# Patient Record
Sex: Female | Born: 1955
Health system: Southern US, Community
[De-identification: ages and names within clinical notes are randomized; demographics above are authoritative.]

## PROBLEM LIST (undated history)

## (undated) DIAGNOSIS — K635 Polyp of colon: Secondary | ICD-10-CM

## (undated) DIAGNOSIS — E782 Mixed hyperlipidemia: Secondary | ICD-10-CM

## (undated) HISTORY — PX: POLYPECTOMY: SHX149

## (undated) HISTORY — PX: OTHER SURGICAL HISTORY: SHX169

## (undated) HISTORY — DX: Polyp of colon: K63.5

## (undated) HISTORY — DX: Mixed hyperlipidemia: E78.2

## (undated) HISTORY — PX: COLONOSCOPY: SHX174

---

## 2019-05-15 ENCOUNTER — Other Ambulatory Visit: Payer: Self-pay

## 2019-05-15 ENCOUNTER — Encounter: Payer: Self-pay | Admitting: Family Medicine

## 2019-05-15 ENCOUNTER — Ambulatory Visit (INDEPENDENT_AMBULATORY_CARE_PROVIDER_SITE_OTHER): Payer: Medicare Other | Admitting: Family Medicine

## 2019-05-15 VITALS — BP 124/80 | HR 68 | Temp 97.6°F | Ht 64.5 in | Wt 123.4 lb

## 2019-05-15 DIAGNOSIS — Z136 Encounter for screening for cardiovascular disorders: Secondary | ICD-10-CM | POA: Diagnosis not present

## 2019-05-15 DIAGNOSIS — E2839 Other primary ovarian failure: Secondary | ICD-10-CM

## 2019-05-15 DIAGNOSIS — Z1231 Encounter for screening mammogram for malignant neoplasm of breast: Secondary | ICD-10-CM | POA: Diagnosis not present

## 2019-05-15 DIAGNOSIS — Z0000 Encounter for general adult medical examination without abnormal findings: Secondary | ICD-10-CM | POA: Diagnosis not present

## 2019-05-15 DIAGNOSIS — E782 Mixed hyperlipidemia: Secondary | ICD-10-CM | POA: Diagnosis not present

## 2019-05-15 DIAGNOSIS — Z1211 Encounter for screening for malignant neoplasm of colon: Secondary | ICD-10-CM | POA: Diagnosis not present

## 2019-05-15 DIAGNOSIS — Z860100 Personal history of colon polyps, unspecified: Secondary | ICD-10-CM

## 2019-05-15 DIAGNOSIS — Z23 Encounter for immunization: Secondary | ICD-10-CM

## 2019-05-15 DIAGNOSIS — E785 Hyperlipidemia, unspecified: Secondary | ICD-10-CM | POA: Insufficient documentation

## 2019-05-15 DIAGNOSIS — D225 Melanocytic nevi of trunk: Secondary | ICD-10-CM | POA: Diagnosis not present

## 2019-05-15 DIAGNOSIS — Z8639 Personal history of other endocrine, nutritional and metabolic disease: Secondary | ICD-10-CM | POA: Diagnosis not present

## 2019-05-15 DIAGNOSIS — Z1159 Encounter for screening for other viral diseases: Secondary | ICD-10-CM

## 2019-05-15 DIAGNOSIS — Z8249 Family history of ischemic heart disease and other diseases of the circulatory system: Secondary | ICD-10-CM

## 2019-05-15 DIAGNOSIS — Z79899 Other long term (current) drug therapy: Secondary | ICD-10-CM

## 2019-05-15 NOTE — Progress Notes (Signed)
Cassandra May is a 63 y.o. female who is new to the practice today and presents for an annual wellness visit and CPE.   She has the following concerns:  States she has been out of her cholesterol medication for the past month. Would like a refill.   Needs dermatologist for skin checks.   Gives blood and takes iron.   Previous medical care:  [facility].  Dr. Ansel Bong   Last CPE/AWV:  March 2019   January 2020 for fracture of sacrum. Tripped down the stairs on christmas morning. Did not have LOC. No falls otherwise.   LMP: age 16   Married and has 4 children. 6 grandchildren.  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 Stays physically fit.   Immunization History  Administered Date(s) Administered  . Fluad Quad(high Dose 65+) 05/15/2019     Tdap 08/2016  Last Pap smear: years ago. States previous pcp said it wasn't necessary any longer Last mammogram: march 2018. Overdue  Last colonoscopy: 5 years ago. Overdue. polyps Last DEXA: never Dentist: overdue. Hx of peridontal disease  Ophtho: June 2020  Exercise: 3-5 times a week. Cycle and swim, walks. yoga Diet: fairly healthy but eats too many sweets   She fell Christmas morning missed step.   Other doctors caring for patient include: none  Depression screen:  See questionnaire below.  Depression screen PHQ 2/9 05/15/2019  Decreased Interest 0  Down, Depressed, Hopeless 0  PHQ - 2 Score 0    Fall Risk Screen: see questionnaire below. Fall Risk  05/15/2019  Falls in the past year? 1  Number falls in past yr: 1  Injury with Fall? 1    ADL screen:  See questionnaire below Functional Status Survey: Is the patient deaf or have difficulty hearing?: No Does the patient have difficulty seeing, even when wearing glasses/contacts?: No Does the patient have difficulty concentrating, remembering, or making decisions?: No Does the patient have difficulty walking or climbing stairs?: No Does the  patient have difficulty dressing or bathing?: No Does the patient have difficulty doing errands alone such as visiting a doctor's office or shopping?: No   End of Life Discussion:  Patient has a living will and medical power of attorney.  Stacy Gardner, her son is her HCPOA.   Review of Systems Constitutional: -fever, -chills, -sweats, -unexpected weight change, -anorexia, -fatigue Allergy: -sneezing, -itching, -congestion Dermatology: denies changing moles, rash, lumps, new worrisome lesions ENT: -runny nose, -ear pain, -sore throat, -hoarseness, -sinus pain, -teeth pain, -tinnitus, -hearing loss, -epistaxis Cardiology:  -chest pain, -palpitations, -edema, -orthopnea, -paroxysmal nocturnal dyspnea Respiratory: -cough, -shortness of breath, -dyspnea on exertion, -wheezing, -hemoptysis Gastroenterology: -abdominal pain, -nausea, -vomiting, -diarrhea, -constipation, -blood in stool, -changes in bowel movement, -dysphagia Hematology: -bleeding or bruising problems Musculoskeletal: -arthralgias, -myalgias, -joint swelling, -back pain, -neck pain, -cramping, -gait changes Ophthalmology: -vision changes, -eye redness, -itching, -discharge Urology: -dysuria, -difficulty urinating, -hematuria, -urinary frequency, -urgency, incontinence Neurology: -headache, -weakness, -tingling, -numbness, -speech abnormality, -memory loss, -falls, -dizziness Psychology:  -depressed mood, -agitation, -sleep problems    PHYSICAL EXAM:  BP 124/80   Pulse 68   Temp 97.6 F (36.4 C)   Ht 5' 4.5" (1.638 m)   Wt 123 lb 6.4 oz (56 kg)   BMI 20.85 kg/m   General Appearance: Alert, cooperative, no distress, appears stated age Head: Normocephalic, without obvious abnormality, atraumatic Eyes: PERRL, conjunctiva/corneas clear, EOM's intact Ears: Normal TM's and external ear canals Nose: Mask in place  Throat: Mask  in place  Neck: Supple, no lymphadenopathy; thyroid: no enlargement/tenderness/nodules; no  carotid bruit or JVD Back: Spine nontender, no curvature, ROM normal, no CVA tenderness Lungs: Clear to auscultation bilaterally without wheezes, rales or ronchi; respirations unlabored Chest Wall: No tenderness or deformity Heart: Regular rate and rhythm, S1 and S2 normal, no murmur, rub or gallop Breast Exam: declines. Mammogram ordered Abdomen: Soft, non-tender, nondistended, normoactive bowel sounds, no masses, no hepatosplenomegaly Genitalia: Normal external genitalia without lesions.  BUS and vagina normal; cervix without lesions, or cervical motion tenderness. No abnormal vaginal discharge.  Uterus and adnexa not enlarged, nontender, no masses.   Rectal: skin tag present at 10 0'clock Extremities: No clubbing, cyanosis or edema Pulses: 2+ and symmetric all extremities Skin: Skin color, texture, turgor normal, no rashes or lesions. Abnormal nevus on her mid back, pigmentation suspicious Lymph nodes: Cervical, supraclavicular, and axillary nodes normal Neurologic: CNII-XII intact, normal strength, sensation and gait; reflexes 2+ and symmetric throughout Psych: Normal mood, affect, hygiene and grooming.  ASSESSMENT/PLAN: Welcome to Medicare preventive visit - Plan: EKG 12-Lead -She is new to the practice and here today for her first Medicare annual wellness visit.  She has had Medicare since June 2020.  Denies any problems with ADLs, memory or mood.  She did have one fall on Christmas, this was mechanical.  No falls since.  She is quite active and enjoys skiing.  Advance directives discussed and she does have these at home.  The MOST form was discussed and filled out today.  Routine general medical examination at a health care facility - Plan: CBC with Differential/Platelet, Comprehensive metabolic panel, TSH, T4, free, Lipid panel -Discussed preventive healthcare.  She will call and schedule her mammogram and bone density.  Referral to GI for colonoscopy.  Declines Pap smear.  States she  has never had an abnormal Pap smear and her previous PCP said that she no longer needed them. She appears to take good care of herself with a healthy diet and exercise.  Immunizations reviewed and flu shot given.  Counseled her on Shingrix vaccine.  Recommend she check with her insurance company and get this at her local pharmacy when she is ready.  Estrogen deficiency - Plan: DG Bone Density -She will call and schedule bone density.  This will be her first DEXA.  History of colon polyps - Plan: Ambulatory referral to Gastroenterology -Per patient she is overdue for her 5-year follow-up  Encounter for screening mammogram for malignant neoplasm of breast - Plan: MM DIGITAL SCREENING BILATERAL  Mixed hyperlipidemia - Plan: Lipid panel -She will reports being well controlled on her current dose of statin.  Refill pending labs  Screen for colon cancer - Plan: Ambulatory referral to Gastroenterology She is overdue for her 5-year colonoscopy per patient.  I do not have these records but I am requesting them.  Need for hepatitis C screening test - Plan: Hepatitis C antibody -Done per guidelines  History of vitamin D deficiency - Plan: VITAMIN D 25 Hydroxy (Vit-D Deficiency, Fractures) -Check vitamin D level and follow-up  Medication management -Adjust vitamin D and statin as appropriate pending labs  Screening for heart disease - Plan: EKG 12-Lead -EKG unremarkable.  Family history of heart disease in brother - Plan: EKG 12-Lead  Needs flu shot - Plan: Flu Vaccine QUAD High Dose(Fluad)  Atypical nevus of back -She will call and schedule with a dermatologist.  A list was provided for her.     Discussed monthly self breast exams and  yearly mammograms; at least 30 minutes of aerobic activity at least 5 days/week and weight-bearing exercise 2x/week; proper sunscreen use reviewed; healthy diet, including goals of calcium and vitamin D intake and alcohol recommendations (less than or equal  to 1 drink/day) reviewed; regular seatbelt use; changing batteries in smoke detectors.  Immunization recommendations discussed.  Colonoscopy recommendations reviewed   Medicare Attestation I have personally reviewed: The patient's medical and social history Their use of alcohol, tobacco or illicit drugs Their current medications and supplements The patient's functional ability including ADLs,fall risks, home safety risks, cognitive, and hearing and visual impairment Diet and physical activities Evidence for depression or mood disorders  The patient's weight, height, and BMI have been recorded in the chart.  I have made referrals, counseling, and provided education to the patient based on review of the above and I have provided the patient with a written personalized care plan for preventive services.     Harland Dingwall, NP-C   05/15/2019

## 2019-05-15 NOTE — Patient Instructions (Addendum)
Dermatology offices  [facility]: Phone #: [phone] Address: [address] [place], [place] [address]  [facility]: Phone: [phone]  Address: [address] [place], [place] [address]  [facility] Address: [address] Barbara Cower [place], [place] [address] Phone: [phone]   Dermatology offices  [facility]: Phone #: [phone] Address: [address] [place], [place] [address]  [facility]: Phone: [phone]  Address: [address] [place], [place] [address]  [facility] Address: [place], [place], [place] [address] Phone: [phone]   [facility] [place] [phone]   Cassandra May , Thank you for taking time to come for your Medicare Wellness Visit. I appreciate your ongoing commitment to your health goals. Please review the following plan we discussed and let me know if I can assist you in the future.   These are the goals we discussed:  Continue taking good care of yourself.   Check back in the spring regarding Shingrix vaccine. You actually would need to get this at your local pharmacy for Medicare to pay for it. Check and see how much it will cost prior to getting the vaccine.   We will call with your lab results.   This is a list of the screening recommended for you and due dates:  Health Maintenance  Topic Date Due  .  Hepatitis C: One time screening is recommended by Center for Disease Control  (CDC) for  adults born from [birth year] through 1967.   24-Jan-1956  . HIV Screening  01/01/1969  . Tetanus Vaccine  01/01/1973  . Pap Smear  01/02/1975  . Mammogram  01/02/2004  . Colon Cancer Screening  01/02/2004  . DEXA scan (bone density measurement)  01/02/2019  . Pneumonia vaccines (1 of 2 - PCV13) 01/02/2019  . Flu Shot  03/03/2019    Preventive Care 65 Years and Older, Female Preventive care refers to  lifestyle choices and visits with your health care provider that can promote health and wellness. This includes:  A yearly physical exam. This is also called an annual well check.  Regular dental and eye exams.  Immunizations.  Screening for certain conditions.  Healthy lifestyle choices, such as diet and exercise. What can I expect for my preventive care visit? Physical exam Your health care provider will check:  Height and weight. These may be used to calculate body mass index (BMI), which is a measurement that tells if you are at a healthy weight.  Heart rate and blood pressure.  Your skin for abnormal spots. Counseling Your health care provider may ask you questions about:  Alcohol, tobacco, and drug use.  Emotional well-being.  Home and relationship well-being.  Sexual activity.  Eating habits.  History of falls.  Memory and ability to understand (cognition).  Work and work Statistician.  Pregnancy and menstrual history. What immunizations do I need?  Influenza (flu) vaccine  This is recommended every year. Tetanus, diphtheria, and pertussis (Tdap) vaccine  You may need a Td booster every 10 years. Varicella (chickenpox) vaccine  You may need this vaccine if you have not already been vaccinated. Zoster (shingles) vaccine  You may need this after age 48. Pneumococcal conjugate (PCV13) vaccine  One dose is recommended after age 33. Pneumococcal polysaccharide (PPSV23) vaccine  One dose is recommended after age 41. Measles, mumps, and rubella (MMR) vaccine  You may need at least one dose of MMR if you were born in 1957 or later. You may  also need a second dose. Meningococcal conjugate (MenACWY) vaccine  You may need this if you have certain conditions. Hepatitis A vaccine  You may need this if you have certain conditions or if you travel or work in places where you may be exposed to hepatitis A. Hepatitis B vaccine  You may need this if you have  certain conditions or if you travel or work in places where you may be exposed to hepatitis B. Haemophilus influenzae type b (Hib) vaccine  You may need this if you have certain conditions. You may receive vaccines as individual doses or as more than one vaccine together in one shot (combination vaccines). Talk with your health care provider about the risks and benefits of combination vaccines. What tests do I need? Blood tests  Lipid and cholesterol levels. These may be checked every 5 years, or more frequently depending on your overall health.  Hepatitis C test.  Hepatitis B test. Screening  Lung cancer screening. You may have this screening every year starting at age 38 if you have a 30-pack-year history of smoking and currently smoke or have quit within the past 15 years.  Colorectal cancer screening. All adults should have this screening starting at age 79 and continuing until age 12. Your health care provider may recommend screening at age 27 if you are at increased risk. You will have tests every 1-10 years, depending on your results and the type of screening test.  Diabetes screening. This is done by checking your blood sugar (glucose) after you have not eaten for a while (fasting). You may have this done every 1-3 years.  Mammogram. This may be done every 1-2 years. Talk with your health care provider about how often you should have regular mammograms.  BRCA-related cancer screening. This may be done if you have a family history of breast, ovarian, tubal, or peritoneal cancers. Other tests  Sexually transmitted disease (STD) testing.  Bone density scan. This is done to screen for osteoporosis. You may have this done starting at age 62. Follow these instructions at home: Eating and drinking  Eat a diet that includes fresh fruits and vegetables, whole grains, lean protein, and low-fat dairy products. Limit your intake of foods with high amounts of sugar, saturated fats, and  salt.  Take vitamin and mineral supplements as recommended by your health care provider.  Do not drink alcohol if your health care provider tells you not to drink.  If you drink alcohol: ? Limit how much you have to 0-1 drink a day. ? Be aware of how much alcohol is in your drink. In the [place] one drink equals one 12 oz bottle of beer (355 mL), one 5 oz glass of wine (148 mL), or one 1 oz glass of hard liquor (44 mL). Lifestyle  Take daily care of your teeth and gums.  Stay active. Exercise for at least 30 minutes on 5 or more days each week.  Do not use any products that contain nicotine or tobacco, such as cigarettes, e-cigarettes, and chewing tobacco. If you need help quitting, ask your health care provider.  If you are sexually active, practice safe sex. Use a condom or other form of protection in order to prevent STIs (sexually transmitted infections).  Talk with your health care provider about taking a low-dose aspirin or statin. What's next?  Go to your health care provider once a year for a well check visit.  Ask your health care provider how often you should have your  eyes and teeth checked.  Stay up to date on all vaccines. This information is not intended to replace advice given to you by your health care provider. Make sure you discuss any questions you have with your health care provider. Document Released: 08/15/2015 Document Revised: 07/13/2018 Document Reviewed: 07/13/2018 Elsevier Patient Education  2020 [employer].

## 2019-05-16 ENCOUNTER — Other Ambulatory Visit: Payer: Self-pay | Admitting: Family Medicine

## 2019-05-16 LAB — COMPREHENSIVE METABOLIC PANEL
ALT: 10 IU/L (ref 0–32)
AST: 24 IU/L (ref 0–40)
Albumin/Globulin Ratio: 1.8 (ref 1.2–2.2)
Albumin: 4.3 g/dL (ref 3.8–4.8)
Alkaline Phosphatase: 85 IU/L (ref 39–117)
BUN/Creatinine Ratio: 17 (ref 12–28)
BUN: 13 mg/dL (ref 8–27)
Bilirubin Total: 0.3 mg/dL (ref 0.0–1.2)
CO2: 23 mmol/L (ref 20–29)
Calcium: 9.9 mg/dL (ref 8.7–10.3)
Chloride: 104 mmol/L (ref 96–106)
Creatinine, Ser: 0.78 mg/dL (ref 0.57–1.00)
GFR calc Af Amer: 92 mL/min/{1.73_m2} (ref >59)
GFR calc non Af Amer: 80 mL/min/{1.73_m2} (ref >59)
Globulin, Total: 2.4 g/dL (ref 1.5–4.5)
Glucose: 91 mg/dL (ref 65–99)
Potassium: 4.3 mmol/L (ref 3.5–5.2)
Sodium: 141 mmol/L (ref 134–144)
Total Protein: 6.7 g/dL (ref 6.0–8.5)

## 2019-05-16 LAB — CBC WITH DIFFERENTIAL/PLATELET
Basophils Absolute: 0.1 10*3/uL (ref 0.0–0.2)
Basos: 1 %
EOS (ABSOLUTE): 0.1 10*3/uL (ref 0.0–0.4)
Eos: 2 %
Hematocrit: 40 % (ref 34.0–46.6)
Hemoglobin: 13 g/dL (ref 11.1–15.9)
Immature Grans (Abs): 0 10*3/uL (ref 0.0–0.1)
Immature Granulocytes: 0 %
Lymphocytes Absolute: 2 10*3/uL (ref 0.7–3.1)
Lymphs: 30 %
MCH: 29 pg (ref 26.6–33.0)
MCHC: 32.5 g/dL (ref 31.5–35.7)
MCV: 89 fL (ref 79–97)
Monocytes Absolute: 0.6 10*3/uL (ref 0.1–0.9)
Monocytes: 9 %
Neutrophils Absolute: 4 10*3/uL (ref 1.4–7.0)
Neutrophils: 58 %
Platelets: 230 10*3/uL (ref 150–450)
RBC: 4.49 x10E6/uL (ref 3.77–5.28)
RDW: 12.3 % (ref 11.7–15.4)
WBC: 6.8 10*3/uL (ref 3.4–10.8)

## 2019-05-16 LAB — VITAMIN D 25 HYDROXY (VIT D DEFICIENCY, FRACTURES): Vit D, 25-Hydroxy: 36.2 ng/mL (ref 30.0–100.0)

## 2019-05-16 LAB — LIPID PANEL
Chol/HDL Ratio: 4.3 ratio (ref 0.0–4.4)
Cholesterol, Total: 278 mg/dL — ABNORMAL HIGH (ref 100–199)
HDL: 65 mg/dL (ref >39)
LDL Chol Calc (NIH): 185 mg/dL — ABNORMAL HIGH (ref 0–99)
Triglycerides: 156 mg/dL — ABNORMAL HIGH (ref 0–149)
VLDL Cholesterol Cal: 28 mg/dL (ref 5–40)

## 2019-05-16 LAB — HEPATITIS C ANTIBODY: Hep C Virus Ab: <0.1 s/co ratio (ref 0.0–0.9)

## 2019-05-16 LAB — T4, FREE: Free T4: 1 ng/dL (ref 0.82–1.77)

## 2019-05-16 LAB — TSH: TSH: 2.65 u[IU]/mL (ref 0.450–4.500)

## 2019-05-16 MED ORDER — ATORVASTATIN CALCIUM 10 MG PO TABS: 10.0000 mg | ORAL_TABLET | Freq: Every day | ORAL | 1 refills | Status: DC

## 2019-05-18 ENCOUNTER — Encounter: Payer: Self-pay | Admitting: Internal Medicine

## 2019-06-15 ENCOUNTER — Encounter: Payer: Self-pay | Admitting: Family Medicine

## 2019-08-07 ENCOUNTER — Other Ambulatory Visit: Payer: Self-pay

## 2019-08-07 ENCOUNTER — Ambulatory Visit
Admission: RE | Admit: 2019-08-07 | Discharge: 2019-08-07 | Disposition: A | Discharge status: Home / Self Care | Payer: Medicare Other | Source: Ambulatory Visit | Attending: Family Medicine | Admitting: Family Medicine

## 2019-08-07 DIAGNOSIS — Z1231 Encounter for screening mammogram for malignant neoplasm of breast: Secondary | ICD-10-CM

## 2019-08-07 DIAGNOSIS — E2839 Other primary ovarian failure: Secondary | ICD-10-CM

## 2019-09-07 ENCOUNTER — Other Ambulatory Visit: Payer: Self-pay | Admitting: Family Medicine

## 2019-09-21 ENCOUNTER — Telehealth: Payer: Self-pay | Admitting: Gastroenterology

## 2019-09-21 NOTE — Telephone Encounter (Signed)
Patient referred for colonoscopy.  Patient had one in 2015 at [place].  I am sending you records for review please advise on scheduling

## 2019-09-21 NOTE — Telephone Encounter (Signed)
I am waiting for the records. Not available in [place] today.

## 2019-09-25 ENCOUNTER — Encounter: Payer: Self-pay | Admitting: Gastroenterology

## 2019-10-22 ENCOUNTER — Ambulatory Visit (AMBULATORY_SURGERY_CENTER): Payer: Self-pay | Admitting: *Deleted

## 2019-10-22 ENCOUNTER — Other Ambulatory Visit: Payer: Self-pay

## 2019-10-22 VITALS — Temp 96.2°F | Ht 64.56 in | Wt 126.0 lb

## 2019-10-22 DIAGNOSIS — Z86010 Personal history of colonic polyps: Secondary | ICD-10-CM

## 2019-10-22 DIAGNOSIS — Z01818 Encounter for other preprocedural examination: Secondary | ICD-10-CM

## 2019-10-22 MED ORDER — SUPREP BOWEL PREP KIT 17.5-3.13-1.6 GM/177ML PO SOLN: 1.0000 | Freq: Once | ORAL | 0 refills | Status: AC

## 2019-10-22 NOTE — Progress Notes (Signed)
09-14-2019 completed vaccine series for COVID   No egg or soy allergy known to patient  No issues with past sedation with any surgeries  or procedures, no intubation problems  No diet pills per patient No home 02 use per patient  No blood thinners per patient  Pt denies issues with constipation  No A fib or A flutter  EMMI video sent to pt's e mail   Due to the COVID-19 pandemic we are asking patients to follow these guidelines. Please only bring one care partner. Please be aware that your care partner may wait in the car in the parking lot or if they feel like they will be too hot to wait in the car, they may wait in the lobby on the 4th floor. All care partners are required to wear a mask the entire time (we do not have any that we can provide them), they need to practice social distancing, and we will do a Covid check for all patient's and care partners when you arrive. Also we will check their temperature and your temperature. If the care partner waits in their car they need to stay in the parking lot the entire time and we will call them on their cell phone when the patient is ready for discharge so they can bring the car to the front of the building. Also all patient's will need to wear a mask into building.

## 2019-11-05 ENCOUNTER — Encounter: Payer: Self-pay | Admitting: Gastroenterology

## 2019-11-05 ENCOUNTER — Other Ambulatory Visit: Payer: Self-pay

## 2019-11-05 ENCOUNTER — Ambulatory Visit (AMBULATORY_SURGERY_CENTER): Payer: Medicare Other | Admitting: Gastroenterology

## 2019-11-05 VITALS — BP 132/74 | HR 61 | Temp 96.6°F | Resp 13 | Ht 64.0 in | Wt 126.0 lb

## 2019-11-05 DIAGNOSIS — Z86010 Personal history of colonic polyps: Secondary | ICD-10-CM

## 2019-11-05 DIAGNOSIS — D123 Benign neoplasm of transverse colon: Secondary | ICD-10-CM | POA: Diagnosis not present

## 2019-11-05 DIAGNOSIS — D125 Benign neoplasm of sigmoid colon: Secondary | ICD-10-CM

## 2019-11-05 DIAGNOSIS — D122 Benign neoplasm of ascending colon: Secondary | ICD-10-CM | POA: Diagnosis not present

## 2019-11-05 MED ORDER — SODIUM CHLORIDE 0.9 % IV SOLN: 500.0000 mL | Freq: Once | INTRAVENOUS | Status: DC

## 2019-11-05 NOTE — Progress Notes (Signed)
Temp check by:LC Vital check by:CW  The patient states no changes in medical or surgical history since pre-visit screening on 10/22/19.

## 2019-11-05 NOTE — Progress Notes (Signed)
To Pacu, VSS. Report to [email or web address] 

## 2019-11-05 NOTE — Op Note (Signed)
[place] Patient Name: Cassandra May Procedure Date: 11/05/2019 8:58 AM MRN: [id number] Endoscopist: Thornton Park MD, MD Age: 64 Referring MD:  Date of Birth: 1956-08-02 Gender: Female Account #: [id number] Procedure:                Colonoscopy Indications:              Surveillance: Personal history of adenomatous                            polyps on last colonoscopy 5 years ago                           No known family history of colon cancer or polyps                           Prior colonoscopies at the [facility] by Dr.                            Hilbert Corrigan included:                           08/28/10: 74mm TVA in the rectum, 22mm sigmoid                            tubular adenoma, sigmoid diverticulosis                           03/06/14: 38mm polyp in the ascending colon,                            pancolonic diverticulosis                           Surveillance recommended in 5 years Medicines:                Monitored Anesthesia Care Procedure:                Pre-Anesthesia Assessment:                           - Prior to the procedure, a History and Physical                            was performed, and patient medications and                            allergies were reviewed. The patient's tolerance of                            previous anesthesia was also reviewed. The risks                            and benefits of the procedure and the sedation                            options and risks were discussed  with the patient.                            All questions were answered, and informed consent                            was obtained. Prior Anticoagulants: The patient has                            taken no previous anticoagulant or antiplatelet                            agents. ASA Grade Assessment: II - A patient with                            mild systemic disease. After reviewing the risks                            and benefits, the  patient was deemed in                            satisfactory condition to undergo the procedure.                           After obtaining informed consent, the colonoscope                            was passed under direct vision. Throughout the                            procedure, the patient's blood pressure, pulse, and                            oxygen saturations were monitored continuously. The                            Colonoscope was introduced through the anus and                            advanced to the 4 cm into the ileum. A second                            forward view of the right colon was performed. The                            colonoscopy was performed without difficulty. The                            patient tolerated the procedure well. The quality                            of the bowel preparation was good. The terminal  ileum, ileocecal valve, appendiceal orifice, and                            rectum were photographed. Scope In: 9:04:26 AM Scope Out: 9:18:00 AM Scope Withdrawal Time: 0 hours 9 minutes 32 seconds  Total Procedure Duration: 0 hours 13 minutes 34 seconds  Findings:                 The perianal and digital rectal examinations were                            normal.                           Non-bleeding internal hemorrhoids were found. The                            hemorrhoids were small.                           Multiple small and large-mouthed diverticula were                            found in the sigmoid colon and descending colon.                           A 3 mm polyp was found in the sigmoid colon. The                            polyp was sessile. The polyp was removed with a                            cold snare. Resection and retrieval were complete.                            Estimated blood loss was minimal.                           Two sessile polyps were found in the hepatic                             flexure. The polyps were 2 mm in size. These polyps                            were removed with a cold snare. Resection and                            retrieval were complete. Estimated blood loss was                            minimal.                           A 3 mm polyp was found in the proximal ascending  colon. The polyp was sessile. The polyp was removed                            with a cold snare. Resection and retrieval were                            complete. Estimated blood loss was minimal.                           The exam was otherwise without abnormality on                            direct and retroflexion views. Complications:            No immediate complications. Estimated blood loss:                            Minimal. Estimated Blood Loss:     Estimated blood loss was minimal. Impression:               - Non-bleeding internal hemorrhoids.                           - Diverticulosis in the sigmoid colon and in the                            descending colon.                           - One 3 mm polyp in the sigmoid colon, removed with                            a cold snare. Resected and retrieved.                           - Two 2 mm polyps at the hepatic flexure, removed                            with a cold snare. Resected and retrieved.                           - One 3 mm polyp in the proximal ascending colon,                            removed with a cold snare. Resected and retrieved.                           - The examination was otherwise normal on direct                            and retroflexion views. Recommendation:           - Patient has a contact number available for  emergencies. The signs and symptoms of potential                            delayed complications were discussed with the                            patient. Return to normal activities tomorrow.                            Written  discharge instructions were provided to the                            patient.                           - Continue present medications.                           - Await pathology results.                           - Repeat colonoscopy date to be determined after                            pending pathology results are reviewed for                            surveillance.                           - Follow a high fiber diet. Drink at least 64                            ounces of water daily. Add a daily stool bulking                            agent such as psyllium (an exampled would be                            Metamucil).                           - Emerging evidence supports eating a diet of                            fruits, vegetables, grains, calcium, and yogurt                            while reducing red meat and alcohol may reduce the                            risk of colon cancer.                           - Thank you for allowing me to be involved in your  colon cancer prevention. Thornton Park MD, MD 11/05/2019 9:27:21 AM This report has been signed electronically.

## 2019-11-05 NOTE — Patient Instructions (Signed)
Handouts provided on polyps, diverticulosis, hemorrhoids, and high-fiber diet.  Follow a high fiber diet. Drink at least 64 ounces of water daily. Add a daily stool bulking agent such as psyllium (an example would be Metamuci).  YOU HAD AN ENDOSCOPIC PROCEDURE TODAY AT [facility]:   Refer to the procedure report that was given to you for any specific questions about what was found during the examination.  If the procedure report does not answer your questions, please call your gastroenterologist to clarify.  If you requested that your care partner not be given the details of your procedure findings, then the procedure report has been included in a sealed envelope for you to review at your convenience later.  YOU SHOULD EXPECT: Some feelings of bloating in the abdomen. Passage of more gas than usual.  Walking can help get rid of the air that was put into your GI tract during the procedure and reduce the bloating. If you had a lower endoscopy (such as a colonoscopy or flexible sigmoidoscopy) you may notice spotting of blood in your stool or on the toilet paper. If you underwent a bowel prep for your procedure, you may not have a normal bowel movement for a few days.  Please Note:  You might notice some irritation and congestion in your nose or some drainage.  This is from the oxygen used during your procedure.  There is no need for concern and it should clear up in a day or so.  SYMPTOMS TO REPORT IMMEDIATELY:   Following lower endoscopy (colonoscopy or flexible sigmoidoscopy):  Excessive amounts of blood in the stool  Significant tenderness or worsening of abdominal pains  Swelling of the abdomen that is new, acute  Fever of 100F or higher   For urgent or emergent issues, a gastroenterologist can be reached at any hour by calling [phone]. Do not use MyChart messaging for urgent concerns.    DIET:  We do recommend a small meal at first, but then you may proceed to your  regular diet.  Drink plenty of fluids but you should avoid alcoholic beverages for 24 hours.  ACTIVITY:  You should plan to take it easy for the rest of today and you should NOT DRIVE or use heavy machinery until tomorrow (because of the sedation medicines used during the test).    FOLLOW UP: Our staff will call the number listed on your records 48-72 hours following your procedure to check on you and address any questions or concerns that you may have regarding the information given to you following your procedure. If we do not reach you, we will leave a message.  We will attempt to reach you two times.  During this call, we will ask if you have developed any symptoms of COVID 19. If you develop any symptoms (ie: fever, flu-like symptoms, shortness of breath, cough etc.) before then, please call [phone].  If you test positive for Covid 19 in the 2 weeks post procedure, please call and report this information to [place].    If any biopsies were taken you will be contacted by phone or by letter within the next 1-3 weeks.  Please call us at [phone] if you have not heard about the biopsies in 3 weeks.    SIGNATURES/CONFIDENTIALITY: You and/or your care partner have signed paperwork which will be entered into your electronic medical record.  These signatures attest to the fact that that the information above on your After Visit Summary has been  reviewed and is understood.  Full responsibility of the confidentiality of this discharge information lies with you and/or your care-partner.

## 2019-11-05 NOTE — Progress Notes (Signed)
Called to room to assist during endoscopic procedure.  Patient ID and intended procedure confirmed with present staff. Received instructions for my participation in the procedure from the performing physician.  

## 2019-11-07 ENCOUNTER — Telehealth: Payer: Self-pay

## 2019-11-07 ENCOUNTER — Encounter: Payer: Self-pay | Admitting: Gastroenterology

## 2019-11-07 NOTE — Telephone Encounter (Signed)
  Follow up Call-  Call back number 11/05/2019  Post procedure Call Back phone  # [phone]  Permission to leave phone message Yes     Patient questions:  Do you have a fever, pain , or abdominal swelling? No. Pain Score  0 *  Have you tolerated food without any problems? Yes.    Have you been able to return to your normal activities? Yes.    Do you have any questions about your discharge instructions: Diet   No. Medications  No. Follow up visit  No.  Do you have questions or concerns about your Care? No.  Actions: * If pain score is 4 or above: No action needed, pain <4. 1. Have you developed a fever since your procedure? no  2.   Have you had an respiratory symptoms (SOB or cough) since your procedure? no  3.   Have you tested positive for COVID 19 since your procedure no  4.   Have you had any family members/close contacts diagnosed with the COVID 19 since your procedure?  no   If yes to any of these questions please route to Joylene John, RN and Erenest Rasher, RN

## 2020-02-16 ENCOUNTER — Other Ambulatory Visit: Payer: Self-pay | Admitting: Family Medicine

## 2020-03-06 ENCOUNTER — Encounter: Payer: Self-pay | Admitting: Family Medicine

## 2020-05-11 ENCOUNTER — Other Ambulatory Visit: Payer: Self-pay | Admitting: Family Medicine

## 2020-05-12 NOTE — Telephone Encounter (Signed)
Pt has upcoming appt on 10/14/ and was last refill 7/19 so she should have enough

## 2020-05-15 ENCOUNTER — Encounter: Payer: Self-pay | Admitting: Family Medicine

## 2020-05-15 ENCOUNTER — Ambulatory Visit (INDEPENDENT_AMBULATORY_CARE_PROVIDER_SITE_OTHER): Payer: Medicare Other | Admitting: Family Medicine

## 2020-05-15 ENCOUNTER — Other Ambulatory Visit: Payer: Self-pay

## 2020-05-15 VITALS — BP 120/80 | HR 66 | Ht 63.0 in | Wt 120.4 lb

## 2020-05-15 DIAGNOSIS — Z114 Encounter for screening for human immunodeficiency virus [HIV]: Secondary | ICD-10-CM

## 2020-05-15 DIAGNOSIS — M8580 Other specified disorders of bone density and structure, unspecified site: Secondary | ICD-10-CM

## 2020-05-15 DIAGNOSIS — Z0000 Encounter for general adult medical examination without abnormal findings: Secondary | ICD-10-CM | POA: Diagnosis not present

## 2020-05-15 DIAGNOSIS — Z86010 Personal history of colonic polyps: Secondary | ICD-10-CM | POA: Diagnosis not present

## 2020-05-15 DIAGNOSIS — Z23 Encounter for immunization: Secondary | ICD-10-CM | POA: Diagnosis not present

## 2020-05-15 DIAGNOSIS — Z8639 Personal history of other endocrine, nutritional and metabolic disease: Secondary | ICD-10-CM | POA: Diagnosis not present

## 2020-05-15 DIAGNOSIS — E782 Mixed hyperlipidemia: Secondary | ICD-10-CM

## 2020-05-15 NOTE — Progress Notes (Signed)
Annual Wellness Visit     Patient: Cassandra May, Female    DOB: 1956/07/15, 64 y.o.   MRN: [id number] Visit Date: 05/15/2020  Today's Provider: Harland Dingwall, NP-C   Chief Complaint  Patient presents with   Medicare Wellness   Hyperlipidemia   Subjective    Cassandra May is a 64 y.o. female who presents today for her Annual Wellness Visit. She reports consuming a low protein and plant base diet. Home exercise routine includes swim,hike, bike and walking. She generally feels well. She reports sleeping well. She does have additional problems to discuss today.   HPI Hyperlipidemia Patient is currently taking Atorvastatin 10 MG. She reports good compliance with treatment and no side effects.  EKG done 2 years ago.  States she had a full cardiac work up including stress test, echo which was negative. 5 years ago at [facility].   Colonoscopy recall in 2024 (3 years) due to polyps  States she does breast exams monthly.  Pap smear UTD  She retired and is enjoying life and staying busy.     Medications: Outpatient Medications Prior to Visit  Medication Sig   atorvastatin (LIPITOR) 10 MG tablet TAKE 1 TABLET BY MOUTH  DAILY   ferrous sulfate 325 (65 FE) MG tablet Take 325 mg by mouth daily with breakfast.   Multiple Vitamins-Iron (MULTIPLE VITAMIN/IRON PO) Take by mouth.   No facility-administered medications prior to visit.    No Known Allergies  Patient Care Team: Girtha Rm, NP-C as PCP - General (Family Medicine)  Review of Systems Pertinent positives and negatives in the history of present illness.    Objective    Vitals: BP 120/80 (BP Location: Right Arm, Patient Position: Sitting, Cuff Size: Normal)    Pulse 66    Ht 5\' 3"  (1.6 m)    Wt 120 lb 6.4 oz (54.6 kg)    SpO2 96%    BMI 21.33 kg/m   Physical Exam Constitutional:      Appearance: Normal appearance. She is normal weight.  Eyes:     Extraocular Movements:  Extraocular movements intact.     Conjunctiva/sclera: Conjunctivae normal.     Pupils: Pupils are equal, round, and reactive to light.  Cardiovascular:     Rate and Rhythm: Normal rate and regular rhythm.     Pulses: Normal pulses.  Pulmonary:     Effort: Pulmonary effort is normal.     Breath sounds: Normal breath sounds.  Abdominal:     General: Abdomen is flat. Bowel sounds are normal.     Palpations: Abdomen is soft.     Tenderness: There is no abdominal tenderness. There is no right CVA tenderness, left CVA tenderness or rebound.  Musculoskeletal:        General: Normal range of motion.     Cervical back: Normal range of motion and neck supple.  Skin:    General: Skin is warm and dry.     Capillary Refill: Capillary refill takes less than 2 seconds.  Neurological:     General: No focal deficit present.     Mental Status: She is alert and oriented to person, place, and time.  Psychiatric:        Mood and Affect: Mood normal.        Behavior: Behavior normal.     Most recent functional status assessment: In your present state of health, do you have any difficulty performing the following activities: 05/15/2020  Hearing? N  Vision?  N  Difficulty concentrating or making decisions? N  Walking or climbing stairs? N  Dressing or bathing? N  Doing errands, shopping? N  Some recent data might be hidden   Most recent fall risk assessment: Fall Risk  05/15/2020  Falls in the past year? 0  Number falls in past yr: 0  Injury with Fall? 0  Risk for fall due to : No Fall Risks  Follow up Falls evaluation completed    Most recent depression screenings: PHQ 2/9 Scores 05/15/2020 05/15/2019  PHQ - 2 Score 0 0   Most recent cognitive screening: No flowsheet data found. Most recent Audit-C alcohol use screening No flowsheet data found. A score of 3 or more in women, and 4 or more in men indicates increased risk for alcohol abuse, EXCEPT if all of the points are from question 1     No results found for any visits on 05/15/20.  Assessment & Plan    Encounter for Medicare annual wellness exam  Routine general medical examination at a health care facility - Plan: TSH, Lipid panel, Comprehensive metabolic panel, CBC with Differential/Platelet  Mixed hyperlipidemia - Plan: Lipid panel  History of vitamin D deficiency - Plan: VITAMIN D 25 Hydroxy (Vit-D Deficiency, Fractures)  History of colon polyps  Osteopenia, unspecified location  Need for influenza vaccination - Plan: Flu Vaccine QUAD High Dose(Fluad)  Screening for HIV without presence of risk factors - Plan: HIV Antibody (routine testing w rflx)   Annual wellness visit done today including the all of the following: Reviewed patient's Family Medical History Reviewed and updated list of patient's medical providers Assessment of cognitive impairment was done Assessed patient's functional ability Established a written schedule for health screening [place] Completed and Reviewed  Exercise Activities and Dietary recommendations Continue healthy diet and exercise.   Continue getting calcium in diet and vitamin D. We will check her vitamin D level.  Continue statin therapy and adjust dose if needed.  Preventive care reviewed. Immunization counseling done.   Immunization History  Administered Date(s) Administered   Fluad Quad(high Dose 65+) 05/15/2019, 05/15/2020   Hepatitis A 11/22/1995, 08/10/2016   Hepatitis B 05/24/1995, 06/28/1995   Influenza-Unspecified 04/18/2012, 08/10/2016, 07/27/2018   PFIZER SARS-COV-2 Vaccination 08/24/2019, 09/14/2019, 04/28/2020   Tdap 08/10/2016   Varicella 05/19/2015    Health Maintenance  Topic Date Due   PNA vac Low Risk Adult (1 of 2 - PCV13) Never done   MAMMOGRAM  08/06/2021   COLONOSCOPY  11/05/2022   TETANUS/TDAP  08/10/2026   INFLUENZA VACCINE  Completed   DEXA SCAN  Completed   COVID-19 Vaccine  Completed    Hepatitis C Screening  Completed     Discussed health benefits of physical activity, and encouraged her to engage in regular exercise appropriate for her age and condition.  Return in about 1 year (around 05/15/2021).      Harland Dingwall, NP-C  [place] [phone] (phone) [phone] (fax)  [place]

## 2020-05-15 NOTE — Patient Instructions (Signed)
  Cassandra May , Thank you for taking time to come for your Medicare Wellness Visit. I appreciate your ongoing commitment to your health goals. Please review the following plan we discussed and let me know if I can assist you in the future.   These are the goals we discussed:  I recommend getting your mammogram between 12-18 months as discussed.   Check with your pharmacy regarding the shingles vaccine.  This is called Shingrix and it is a 2 shot series.  If you decide you would like to get the pneumonia vaccine, you can schedule a nurse visit to get this here in our office.  I will be in touch with your lab results     This is a list of the screening recommended for you and due dates:  Health Maintenance  Topic Date Due  . Pneumonia vaccines (1 of 2 - PCV13) Never done  . Flu Shot  03/02/2020  . Mammogram  08/06/2021  . Colon Cancer Screening  11/05/2022  . Tetanus Vaccine  08/10/2026  . DEXA scan (bone density measurement)  Completed  . COVID-19 Vaccine  Completed  .  Hepatitis C: One time screening is recommended by Center for Disease Control  (CDC) for  adults born from [birth year] through 1967.   Completed

## 2020-05-16 LAB — COMPREHENSIVE METABOLIC PANEL
ALT: 14 IU/L (ref 0–32)
AST: 21 IU/L (ref 0–40)
Albumin/Globulin Ratio: 1.8 (ref 1.2–2.2)
Albumin: 4.6 g/dL (ref 3.8–4.8)
Alkaline Phosphatase: 103 IU/L (ref 44–121)
BUN/Creatinine Ratio: 14 (ref 12–28)
BUN: 10 mg/dL (ref 8–27)
Bilirubin Total: 0.3 mg/dL (ref 0.0–1.2)
CO2: 22 mmol/L (ref 20–29)
Calcium: 9.8 mg/dL (ref 8.7–10.3)
Chloride: 104 mmol/L (ref 96–106)
Creatinine, Ser: 0.71 mg/dL (ref 0.57–1.00)
GFR calc Af Amer: 103 mL/min/{1.73_m2} (ref >59)
GFR calc non Af Amer: 89 mL/min/{1.73_m2} (ref >59)
Globulin, Total: 2.5 g/dL (ref 1.5–4.5)
Glucose: 93 mg/dL (ref 65–99)
Potassium: 4.4 mmol/L (ref 3.5–5.2)
Sodium: 140 mmol/L (ref 134–144)
Total Protein: 7.1 g/dL (ref 6.0–8.5)

## 2020-05-16 LAB — CBC WITH DIFFERENTIAL/PLATELET
Basophils Absolute: 0.1 10*3/uL (ref 0.0–0.2)
Basos: 1 %
EOS (ABSOLUTE): 0.1 10*3/uL (ref 0.0–0.4)
Eos: 1 %
Hematocrit: 41.9 % (ref 34.0–46.6)
Hemoglobin: 14.1 g/dL (ref 11.1–15.9)
Immature Grans (Abs): 0 10*3/uL (ref 0.0–0.1)
Immature Granulocytes: 0 %
Lymphocytes Absolute: 1.7 10*3/uL (ref 0.7–3.1)
Lymphs: 27 %
MCH: 30.3 pg (ref 26.6–33.0)
MCHC: 33.7 g/dL (ref 31.5–35.7)
MCV: 90 fL (ref 79–97)
Monocytes Absolute: 0.5 10*3/uL (ref 0.1–0.9)
Monocytes: 8 %
Neutrophils Absolute: 4 10*3/uL (ref 1.4–7.0)
Neutrophils: 63 %
Platelets: 269 10*3/uL (ref 150–450)
RBC: 4.66 x10E6/uL (ref 3.77–5.28)
RDW: 12.1 % (ref 11.7–15.4)
WBC: 6.3 10*3/uL (ref 3.4–10.8)

## 2020-05-16 LAB — HIV ANTIBODY (ROUTINE TESTING W REFLEX): HIV Screen 4th Generation wRfx: NONREACTIVE

## 2020-05-16 LAB — VITAMIN D 25 HYDROXY (VIT D DEFICIENCY, FRACTURES): Vit D, 25-Hydroxy: 38.4 ng/mL (ref 30.0–100.0)

## 2020-05-16 LAB — TSH: TSH: 1.78 u[IU]/mL (ref 0.450–4.500)

## 2020-05-16 LAB — LIPID PANEL
Chol/HDL Ratio: 3.1 ratio (ref 0.0–4.4)
Cholesterol, Total: 219 mg/dL — ABNORMAL HIGH (ref 100–199)
HDL: 71 mg/dL (ref >39)
LDL Chol Calc (NIH): 128 mg/dL — ABNORMAL HIGH (ref 0–99)
Triglycerides: 116 mg/dL (ref 0–149)
VLDL Cholesterol Cal: 20 mg/dL (ref 5–40)

## 2020-05-22 DIAGNOSIS — L718 Other rosacea: Secondary | ICD-10-CM | POA: Diagnosis not present

## 2020-05-22 DIAGNOSIS — L814 Other melanin hyperpigmentation: Secondary | ICD-10-CM | POA: Diagnosis not present

## 2020-05-22 DIAGNOSIS — D225 Melanocytic nevi of trunk: Secondary | ICD-10-CM | POA: Diagnosis not present

## 2020-05-22 DIAGNOSIS — L821 Other seborrheic keratosis: Secondary | ICD-10-CM | POA: Diagnosis not present

## 2020-05-27 ENCOUNTER — Other Ambulatory Visit: Payer: Self-pay | Admitting: Family Medicine

## 2020-09-19 DIAGNOSIS — H2513 Age-related nuclear cataract, bilateral: Secondary | ICD-10-CM | POA: Diagnosis not present

## 2020-09-24 ENCOUNTER — Other Ambulatory Visit: Payer: Self-pay | Admitting: Family Medicine

## 2020-09-24 DIAGNOSIS — Z1231 Encounter for screening mammogram for malignant neoplasm of breast: Secondary | ICD-10-CM

## 2020-10-06 ENCOUNTER — Other Ambulatory Visit: Payer: Self-pay

## 2020-10-06 ENCOUNTER — Ambulatory Visit
Admission: RE | Admit: 2020-10-06 | Discharge: 2020-10-06 | Disposition: A | Discharge status: Home / Self Care | Payer: Medicare Other | Source: Ambulatory Visit

## 2020-10-06 DIAGNOSIS — Z1231 Encounter for screening mammogram for malignant neoplasm of breast: Secondary | ICD-10-CM | POA: Diagnosis not present

## 2020-10-10 ENCOUNTER — Other Ambulatory Visit: Payer: Self-pay | Admitting: Family Medicine

## 2020-10-10 DIAGNOSIS — R928 Other abnormal and inconclusive findings on diagnostic imaging of breast: Secondary | ICD-10-CM

## 2020-10-14 ENCOUNTER — Other Ambulatory Visit: Payer: Self-pay | Admitting: Family Medicine

## 2020-10-27 ENCOUNTER — Other Ambulatory Visit: Payer: Self-pay

## 2020-10-27 ENCOUNTER — Ambulatory Visit
Admission: RE | Admit: 2020-10-27 | Discharge: 2020-10-27 | Disposition: A | Discharge status: Home / Self Care | Payer: Medicare Other | Source: Ambulatory Visit | Attending: Family Medicine | Admitting: Family Medicine

## 2020-10-27 ENCOUNTER — Ambulatory Visit: Payer: Medicare Other

## 2020-10-27 DIAGNOSIS — R928 Other abnormal and inconclusive findings on diagnostic imaging of breast: Secondary | ICD-10-CM | POA: Diagnosis not present

## 2020-12-22 ENCOUNTER — Ambulatory Visit (INDEPENDENT_AMBULATORY_CARE_PROVIDER_SITE_OTHER): Payer: Medicare Other | Admitting: Family Medicine

## 2020-12-22 ENCOUNTER — Encounter: Payer: Self-pay | Admitting: Family Medicine

## 2020-12-22 ENCOUNTER — Other Ambulatory Visit: Payer: Self-pay

## 2020-12-22 DIAGNOSIS — S46911A Strain of unspecified muscle, fascia and tendon at shoulder and upper arm level, right arm, initial encounter: Secondary | ICD-10-CM | POA: Diagnosis not present
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
## 2021-01-08 ENCOUNTER — Other Ambulatory Visit: Payer: Self-pay | Admitting: Family Medicine

## 2021-04-26 NOTE — Progress Notes (Signed)
Cassandra May is a 65 y.o. female who presents for annual wellness visit and follow-up on chronic medical conditions.  She has the following concerns:  Osteopenia- taking vitamin D supplement. Gets calcium in her diet. Exercises regularly.   HL- taking atorvastatin 10 mg daily.    Immunization History  Administered Date(s) Administered   Fluad Quad(high Dose 65+) 05/15/2019, 05/15/2020   Hepatitis A 11/22/1995, 08/10/2016   Hepatitis B 05/24/1995, 06/28/1995   Influenza-Unspecified 04/18/2012, 08/10/2016, 07/27/2018, 04/15/2021   PFIZER(Purple Top)SARS-COV-2 Vaccination 08/24/2019, 09/14/2019, 04/28/2020, 11/02/2020   Pfizer Covid-19 Vaccine Bivalent Booster 31yrs & up 04/15/2021   Tdap 08/10/2016   Varicella 05/19/2015   Last Pap smear: Exact date unknown- last was around 2018  Last mammogram: 10/27/2020 Last colonoscopy: 11/05/19, every 3 years  Last DEXA: 08/07/19  Dentist: Twice a year Ophtho: once a year Exercise: Does cardio daily 30-90 minutes per day every day  Other doctors caring for patient include: Dermatology :  Dr. Lennie Odor   Depression screen:  See questionnaire below.  Depression screen [facility] 2/9 04/27/2021 05/15/2020 05/15/2019  Decreased Interest 0 0 0  Down, Depressed, Hopeless 0 0 0  PHQ - 2 Score 0 0 0    Fall Risk Screen: see questionnaire below. Fall Risk  04/27/2021 05/15/2020 05/15/2019  Falls in the past year? 0 0 1  Number falls in past yr: 0 0 1  Injury with Fall? 0 0 1  Risk for fall due to : No Fall Risks No Fall Risks -  Follow up Falls evaluation completed Falls evaluation completed -    ADL screen:  See questionnaire below Functional Status Survey: Is the patient deaf or have difficulty hearing?: No Does the patient have difficulty seeing, even when wearing glasses/contacts?: No Does the patient have difficulty concentrating, remembering, or making decisions?: No Does the patient have difficulty walking or climbing stairs?:  No Does the patient have difficulty dressing or bathing?: No Does the patient have difficulty doing errands alone such as visiting a doctor's office or shopping?: No   End of Life Discussion:  Patient has a living will and medical power of attorney. MOST form reviewed   Review of Systems Constitutional: -fever, -chills, -sweats, -unexpected weight change, -anorexia, -fatigue Allergy: -sneezing, -itching, -congestion Dermatology: denies changing moles, rash, lumps, new worrisome lesions ENT: -runny nose, -ear pain, -sore throat, -hoarseness, -sinus pain, -teeth pain, -tinnitus, -hearing loss, -epistaxis Cardiology:  -chest pain, -palpitations, -edema, -orthopnea, -paroxysmal nocturnal dyspnea Respiratory: -cough, -shortness of breath, -dyspnea on exertion, -wheezing, -hemoptysis Gastroenterology: -abdominal pain, -nausea, -vomiting, -diarrhea, -constipation, -blood in stool, -changes in bowel movement, -dysphagia Hematology: -bleeding or bruising problems Musculoskeletal: -arthralgias, -myalgias, -joint swelling, -back pain, -neck pain, -cramping, -gait changes Ophthalmology: -vision changes, -eye redness, -itching, -discharge Urology: -dysuria, -difficulty urinating, -hematuria, -urinary frequency, -urgency, incontinence Neurology: -headache, -weakness, -tingling, -numbness, -speech abnormality, -memory loss, -falls, -dizziness Psychology:  -depressed mood, -agitation, -sleep problems    PHYSICAL EXAM:  BP 136/86   Pulse 70   Ht 5' 3.25" (1.607 m)   Wt 117 lb 9.6 oz (53.3 kg)   SpO2 97%   BMI 20.67 kg/m   General Appearance: Alert, cooperative, no distress, appears stated age Head: Normocephalic, without obvious abnormality, atraumatic Eyes: PERRL Neck: Supple, no JVD Lungs: Clear to auscultation bilaterally without wheezes, rales or ronchi; respirations unlabored Chest Wall: No tenderness or deformity Heart: Regular rate and rhythm, S1 and S2 normal, no murmur, rub or  gallop Extremities: No clubbing, cyanosis or edema Skin: Skin color,  texture, turgor normal, no rashes or lesions Psych: Normal mood, affect, hygiene and grooming.  ASSESSMENT/PLAN: Medicare annual wellness visit, subsequent - Plan: CBC with Differential/Platelet, Comprehensive metabolic panel -Here for a fasting Medicare wellness visit.  Preventive health care reviewed.  She is taking good care of her self and in good spirits.  Regular exercise and healthy diet.  She is up-to-date on eye and dental exams.  Immunizations reviewed and she is also up-to-date on these.  No concerns with memory, ADLs, depression.  No falls.  Advanced directive counseling done.  Osteopenia, unspecified location - Plan: CBC with Differential/Platelet, Comprehensive metabolic panel -Continue getting adequate calcium in diet, taking a vitamin D supplement and doing weightbearing exercises.  She is aware that she will be due for her 2-year DEXA follow-up in January 2023.  Mixed hyperlipidemia - Plan: Lipid panel, CBC with Differential/Platelet, Comprehensive metabolic panel -Continue statin therapy.  Follow-up pending lipid panel results.  Continue low-fat diet and exercise.  Medication management - Plan: Lipid panel, CBC with Differential/Platelet, Comprehensive metabolic panel    Discussed monthly self breast exams and yearly mammograms; at least 30 minutes of aerobic activity at least 5 days/week and weight-bearing exercise 2x/week; proper sunscreen use reviewed; healthy diet, including goals of calcium and vitamin D intake and alcohol recommendations (less than or equal to 1 drink/day) reviewed; regular seatbelt use; changing batteries in smoke detectors.  Immunization recommendations discussed.  Colonoscopy recommendations reviewed   Medicare Attestation I have personally reviewed: The patient's medical and social history Their use of alcohol, tobacco or illicit drugs Their current medications and  supplements The patient's functional ability including ADLs,fall risks, home safety risks, cognitive, and hearing and visual impairment Diet and physical activities Evidence for depression or mood disorders  The patient's weight, height, and BMI have been recorded in the chart.  I have made referrals, counseling, and provided education to the patient based on review of the above and I have provided the patient with a written personalized care plan for preventive services.     Harland Dingwall, NP-C   04/27/2021

## 2021-04-27 ENCOUNTER — Encounter: Payer: Medicare Other | Admitting: Family Medicine

## 2021-04-27 ENCOUNTER — Ambulatory Visit (INDEPENDENT_AMBULATORY_CARE_PROVIDER_SITE_OTHER): Payer: Medicare Other | Admitting: Family Medicine

## 2021-04-27 ENCOUNTER — Other Ambulatory Visit: Payer: Self-pay

## 2021-04-27 ENCOUNTER — Encounter: Payer: Self-pay | Admitting: Family Medicine

## 2021-04-27 VITALS — BP 136/86 | HR 70 | Ht 63.25 in | Wt 117.6 lb

## 2021-04-27 DIAGNOSIS — Z0000 Encounter for general adult medical examination without abnormal findings: Secondary | ICD-10-CM | POA: Diagnosis not present

## 2021-04-27 DIAGNOSIS — M8580 Other specified disorders of bone density and structure, unspecified site: Secondary | ICD-10-CM | POA: Diagnosis not present

## 2021-04-27 DIAGNOSIS — E782 Mixed hyperlipidemia: Secondary | ICD-10-CM

## 2021-04-27 DIAGNOSIS — Z79899 Other long term (current) drug therapy: Secondary | ICD-10-CM

## 2021-04-27 NOTE — Patient Instructions (Addendum)
  Ms. Carel , Thank you for taking time to come for your Medicare Wellness Visit. I appreciate your ongoing commitment to your health goals. Please review the following plan we discussed and let me know if I can assist you in the future.    This is a list of the screening recommended for you and due dates:  Health Maintenance  Topic Date Due   Zoster (Shingles) Vaccine (1 of 2) Never done   COVID-19 Vaccine (5 - Booster for Pfizer series) 03/04/2021   Mammogram  10/07/2022   Colon Cancer Screening  11/05/2022   Tetanus Vaccine  08/10/2026   Flu Shot  Completed   DEXA scan (bone density measurement)  Completed   Hepatitis C Screening: USPSTF Recommendation to screen - Ages 18-79 yo.  Completed   HPV Vaccine  Aged Out

## 2021-04-28 ENCOUNTER — Other Ambulatory Visit: Payer: Self-pay | Admitting: Family Medicine

## 2021-04-28 LAB — CBC WITH DIFFERENTIAL/PLATELET
Basophils Absolute: 0.1 10*3/uL (ref 0.0–0.2)
Basos: 1 %
EOS (ABSOLUTE): 0.2 10*3/uL (ref 0.0–0.4)
Eos: 3 %
Hematocrit: 37.7 % (ref 34.0–46.6)
Hemoglobin: 12.2 g/dL (ref 11.1–15.9)
Immature Grans (Abs): 0 10*3/uL (ref 0.0–0.1)
Immature Granulocytes: 0 %
Lymphocytes Absolute: 2 10*3/uL (ref 0.7–3.1)
Lymphs: 29 %
MCH: 28.3 pg (ref 26.6–33.0)
MCHC: 32.4 g/dL (ref 31.5–35.7)
MCV: 88 fL (ref 79–97)
Monocytes Absolute: 0.7 10*3/uL (ref 0.1–0.9)
Monocytes: 9 %
Neutrophils Absolute: 4.1 10*3/uL (ref 1.4–7.0)
Neutrophils: 58 %
Platelets: 312 10*3/uL (ref 150–450)
RBC: 4.31 x10E6/uL (ref 3.77–5.28)
RDW: 13.4 % (ref 11.7–15.4)
WBC: 7 10*3/uL (ref 3.4–10.8)

## 2021-04-28 LAB — LIPID PANEL
Chol/HDL Ratio: 3.1 ratio (ref 0.0–4.4)
Cholesterol, Total: 221 mg/dL — ABNORMAL HIGH (ref 100–199)
HDL: 72 mg/dL (ref >39)
LDL Chol Calc (NIH): 137 mg/dL — ABNORMAL HIGH (ref 0–99)
Triglycerides: 69 mg/dL (ref 0–149)
VLDL Cholesterol Cal: 12 mg/dL (ref 5–40)

## 2021-04-28 LAB — COMPREHENSIVE METABOLIC PANEL
ALT: 19 IU/L (ref 0–32)
AST: 34 IU/L (ref 0–40)
Albumin/Globulin Ratio: 1.9 (ref 1.2–2.2)
Albumin: 4.4 g/dL (ref 3.8–4.8)
Alkaline Phosphatase: 99 IU/L (ref 44–121)
BUN/Creatinine Ratio: 15 (ref 12–28)
BUN: 12 mg/dL (ref 8–27)
Bilirubin Total: 0.4 mg/dL (ref 0.0–1.2)
CO2: 22 mmol/L (ref 20–29)
Calcium: 9.5 mg/dL (ref 8.7–10.3)
Chloride: 105 mmol/L (ref 96–106)
Creatinine, Ser: 0.78 mg/dL (ref 0.57–1.00)
Globulin, Total: 2.3 g/dL (ref 1.5–4.5)
Glucose: 90 mg/dL (ref 65–99)
Potassium: 4.5 mmol/L (ref 3.5–5.2)
Sodium: 142 mmol/L (ref 134–144)
Total Protein: 6.7 g/dL (ref 6.0–8.5)
eGFR: 83 mL/min/{1.73_m2} (ref >59)

## 2021-04-28 MED ORDER — ATORVASTATIN CALCIUM 20 MG PO TABS: 20.0000 mg | ORAL_TABLET | Freq: Every day | ORAL | 1 refills | Status: DC

## 2021-05-18 ENCOUNTER — Ambulatory Visit: Payer: Medicare Other | Admitting: Family Medicine

## 2021-09-02 ENCOUNTER — Other Ambulatory Visit: Payer: Self-pay

## 2021-09-02 MED ORDER — ATORVASTATIN CALCIUM 20 MG PO TABS: 20.0000 mg | ORAL_TABLET | Freq: Every day | ORAL | 1 refills | Status: DC

## 2021-11-02 ENCOUNTER — Other Ambulatory Visit: Payer: Self-pay | Admitting: Physician Assistant

## 2021-11-02 DIAGNOSIS — Z1231 Encounter for screening mammogram for malignant neoplasm of breast: Secondary | ICD-10-CM

## 2021-11-04 ENCOUNTER — Ambulatory Visit
Admission: RE | Admit: 2021-11-04 | Discharge: 2021-11-04 | Disposition: A | Discharge status: Home / Self Care | Payer: Medicare Other | Source: Ambulatory Visit

## 2021-11-04 DIAGNOSIS — Z1231 Encounter for screening mammogram for malignant neoplasm of breast: Secondary | ICD-10-CM

## 2022-01-20 ENCOUNTER — Other Ambulatory Visit: Payer: Self-pay | Admitting: Medical

## 2022-01-21 NOTE — Telephone Encounter (Signed)
Sent mychart message advising pt that an appt is needed

## 2022-04-23 ENCOUNTER — Encounter: Payer: Self-pay | Admitting: Family Medicine

## 2022-04-23 ENCOUNTER — Ambulatory Visit (INDEPENDENT_AMBULATORY_CARE_PROVIDER_SITE_OTHER): Payer: Medicare Other | Admitting: Family Medicine

## 2022-04-23 VITALS — BP 134/86 | HR 70 | Temp 97.8°F | Ht 63.25 in | Wt 120.0 lb

## 2022-04-23 DIAGNOSIS — M8580 Other specified disorders of bone density and structure, unspecified site: Secondary | ICD-10-CM | POA: Diagnosis not present

## 2022-04-23 DIAGNOSIS — R42 Dizziness and giddiness: Secondary | ICD-10-CM

## 2022-04-23 DIAGNOSIS — H6092 Unspecified otitis externa, left ear: Secondary | ICD-10-CM | POA: Diagnosis not present

## 2022-04-23 DIAGNOSIS — Z860100 Personal history of colon polyps, unspecified: Secondary | ICD-10-CM

## 2022-04-23 DIAGNOSIS — D2271 Melanocytic nevi of right lower limb, including hip: Secondary | ICD-10-CM | POA: Diagnosis not present

## 2022-04-23 DIAGNOSIS — E782 Mixed hyperlipidemia: Secondary | ICD-10-CM

## 2022-04-23 DIAGNOSIS — E2839 Other primary ovarian failure: Secondary | ICD-10-CM

## 2022-04-23 LAB — CBC WITH DIFFERENTIAL/PLATELET
Basophils Absolute: 0.1 10*3/uL (ref 0.0–0.1)
Basophils Relative: 0.9 % (ref 0.0–3.0)
Eosinophils Absolute: 0.1 10*3/uL (ref 0.0–0.7)
Eosinophils Relative: 1.8 % (ref 0.0–5.0)
HCT: 38.3 % (ref 36.0–46.0)
Hemoglobin: 12.7 g/dL (ref 12.0–15.0)
Lymphocytes Relative: 33.2 % (ref 12.0–46.0)
Lymphs Abs: 2.6 10*3/uL (ref 0.7–4.0)
MCHC: 33.2 g/dL (ref 30.0–36.0)
MCV: 85 fl (ref 78.0–100.0)
Monocytes Absolute: 0.7 10*3/uL (ref 0.1–1.0)
Monocytes Relative: 8.4 % (ref 3.0–12.0)
Neutro Abs: 4.3 10*3/uL (ref 1.4–7.7)
Neutrophils Relative %: 55.7 % (ref 43.0–77.0)
Platelets: 235 10*3/uL (ref 150.0–400.0)
RBC: 4.51 Mil/uL (ref 3.87–5.11)
RDW: 14.7 % (ref 11.5–15.5)
WBC: 7.8 10*3/uL (ref 4.0–10.5)

## 2022-04-23 LAB — COMPREHENSIVE METABOLIC PANEL
ALT: 13 U/L (ref 0–35)
AST: 22 U/L (ref 0–37)
Albumin: 4.2 g/dL (ref 3.5–5.2)
Alkaline Phosphatase: 77 U/L (ref 39–117)
BUN: 15 mg/dL (ref 6–23)
CO2: 28 mEq/L (ref 19–32)
Calcium: 9.6 mg/dL (ref 8.4–10.5)
Chloride: 104 mEq/L (ref 96–112)
Creatinine, Ser: 0.83 mg/dL (ref 0.40–1.20)
GFR: 72.5 mL/min (ref >60.00)
Glucose, Bld: 90 mg/dL (ref 70–99)
Potassium: 4.4 mEq/L (ref 3.5–5.1)
Sodium: 140 mEq/L (ref 135–145)
Total Bilirubin: 0.5 mg/dL (ref 0.2–1.2)
Total Protein: 7.1 g/dL (ref 6.0–8.3)

## 2022-04-23 LAB — LIPID PANEL
Cholesterol: 192 mg/dL (ref 0–200)
HDL: 76.6 mg/dL (ref >39.00)
LDL Cholesterol: 98 mg/dL (ref 0–99)
NonHDL: 115.44
Total CHOL/HDL Ratio: 3
Triglycerides: 88 mg/dL (ref 0.0–149.0)
VLDL: 17.6 mg/dL (ref 0.0–40.0)

## 2022-04-23 LAB — T4, FREE: Free T4: 0.76 ng/dL (ref 0.60–1.60)

## 2022-04-23 LAB — TSH: TSH: 2.16 u[IU]/mL (ref 0.35–5.50)

## 2022-04-23 MED ORDER — CIPRO HC 0.2-1 % OT SUSP: 3.0000 [drp] | Freq: Two times a day (BID) | OTIC | 0 refills | Status: DC

## 2022-04-23 NOTE — Patient Instructions (Signed)
  Please go downstairs for your labs today.   Call and schedule your bone density at [facility]  We will be in touch with your results.

## 2022-04-23 NOTE — Progress Notes (Unsigned)
New Patient Office Visit  Subjective    Patient ID: Cassandra May, female    DOB: [birth year]  Age: 66 y.o. MRN: [id number]  CC:  Chief Complaint  Patient presents with   Establish Care    No concerns, due for well visit    HPI Merrilyn Legler presents to establish care. She was my patient at my previous practice.   Her main concern today is to have atorvastatin refilled.  She also reports dizziness upon awakening one morning a few weeks ago. She felt like it was vertigo. She had a similar episode a few years ago.  She is a retired PT and she performed Psychiatric nurse. No dizziness today.   Denies fever, chills, headache, blurred or double vision, URI symptoms, chest pain, palpitations, shortness of breath, abdominal pain, N/V/D, urinary symptoms, LE edema.   [place]   She has an atypical mole on her right lower leg that seems to be enlarging. She has a Paediatric nurse.   Mammogram in April 2023 and neg Colonoscopy due in April 2024   Osteopenia with last DEXA in 08/2019.     Outpatient Encounter Medications as of 04/23/2022  Medication Sig   ciprofloxacin-hydrocortisone (CIPRO HC) OTIC suspension Place 3 drops into both ears 2 (two) times daily. Use for 7-10 days only.   Multiple Vitamins-Iron (MULTIPLE VITAMIN/IRON PO) Take by mouth.   [DISCONTINUED] atorvastatin (LIPITOR) 20 MG tablet Take 1 tablet (20 mg total) by mouth daily.   atorvastatin (LIPITOR) 20 MG tablet Take 1 tablet (20 mg total) by mouth daily.   No facility-administered encounter medications on file as of 04/23/2022.    Past Medical History:  Diagnosis Date   Mixed hyperlipidemia     Past Surgical History:  Procedure Laterality Date   COLONOSCOPY     POLYPECTOMY      Family History  Problem Relation Age of Onset   Non-Hodgkin's lymphoma Mother        died at 73   Heart attack Father 71   Breast cancer Maternal Grandmother 69   Heart attack Brother 17    Diabetes Brother    Arthritis Brother    Kidney disease Son        PKD   Kidney disease Son        PKD   Colon cancer Neg Hx    Colon polyps Neg Hx    Esophageal cancer Neg Hx    Rectal cancer Neg Hx    Stomach cancer Neg Hx     Social History   Socioeconomic History   Marital status: Married    Spouse name: Not on file   Number of children: Not on file   Years of education: Not on file   Highest education level: Not on file  Occupational History   Not on file  Tobacco Use   Smoking status: Never   Smokeless tobacco: Never  Vaping Use   Vaping Use: Never used  Substance and Sexual Activity   Alcohol use: Yes    Comment: occassionally   Drug use: Never   Sexual activity: Not on file  Other Topics Concern   Not on file  Social History Narrative   Not on file   Social Determinants of Health   Financial Resource Strain: Not on file  Food Insecurity: Not on file  Transportation Needs: Not on file  Physical Activity: Not on file  Stress: Not on file  Social Connections: Not on file  Intimate Partner Violence: Not on file    ROS      Objective    BP 134/86 (BP Location: Left Arm, Patient Position: Sitting, Cuff Size: Normal)   Pulse 70   Temp 97.8 F (36.6 C) (Temporal)   Ht 5' 3.25" (1.607 m)   Wt 120 lb (54.4 kg)   SpO2 98%   BMI 21.09 kg/m   Physical Exam Constitutional:      General: She is not in acute distress.    Appearance: She is not ill-appearing.  HENT:     Right Ear: Tympanic membrane normal. There is no impacted cerumen.     Left Ear: Tympanic membrane normal. There is no impacted cerumen.     Ears:     Comments: Bilateral ear canals with erythema and irritation, left >right     Nose: Nose normal.     Mouth/Throat:     Mouth: Mucous membranes are moist.  Eyes:     Extraocular Movements: Extraocular movements intact.     Conjunctiva/sclera: Conjunctivae normal.  Skin:    General: Skin is warm and dry.  Neurological:     General:  No focal deficit present.     Mental Status: She is alert and oriented to person, place, and time.  Psychiatric:        Mood and Affect: Mood normal.        Behavior: Behavior normal.        Thought Content: Thought content normal.         Assessment & Plan:   Problem List Items Addressed This Visit       Nervous and Auditory   Inflammation of left ear canal    Most likely due to recent swimming in the lake.  Early otitis externa.  Treat with Ciprodex otic.      Relevant Medications   ciprofloxacin-hydrocortisone (CIPRO HC) OTIC suspension     Musculoskeletal and Integument   Atypical nevus of right lower leg   Osteopenia    Last bone density January 2021.  Bone density ordered and she will call and schedule.  Continue getting adequate calcium in her diet, taking vitamin D supplement and weightbearing exercises.      Relevant Orders   DG Bone Density     Other   Estrogen deficiency    DEXA ordered      Relevant Orders   DG Bone Density   History of colon polyps    Due for colonoscopy recall in April 2024      Mixed hyperlipidemia - Primary    Fasting lipids ordered.  LDL 98.  Refill atorvastatin.      Relevant Medications   atorvastatin (LIPITOR) 20 MG tablet   Other Relevant Orders   Lipid panel (Completed)   Vertigo    Recent dizziness consistent with vertigo but I will check labs to rule out underlying etiology.  She will follow-up if this occurs repeatedly      Other Visit Diagnoses     Dizziness       Relevant Orders   CBC with Differential/Platelet (Completed)   Comprehensive metabolic panel (Completed)   TSH (Completed)   T4, free (Completed)      She plans to get flu shot and immunizations at her pharmacy Return for pending labs.   Harland Dingwall, NP-C

## 2022-04-24 DIAGNOSIS — D2271 Melanocytic nevi of right lower limb, including hip: Secondary | ICD-10-CM | POA: Insufficient documentation

## 2022-04-24 DIAGNOSIS — R42 Dizziness and giddiness: Secondary | ICD-10-CM | POA: Insufficient documentation

## 2022-04-24 DIAGNOSIS — H6092 Unspecified otitis externa, left ear: Secondary | ICD-10-CM | POA: Insufficient documentation

## 2022-04-24 MED ORDER — ATORVASTATIN CALCIUM 20 MG PO TABS: 20.0000 mg | ORAL_TABLET | Freq: Every day | ORAL | 1 refills | Status: DC

## 2022-04-24 NOTE — Assessment & Plan Note (Signed)
DEXA ordered.  

## 2022-04-24 NOTE — Assessment & Plan Note (Signed)
Recent dizziness consistent with vertigo but I will check labs to rule out underlying etiology.  She will follow-up if this occurs repeatedly

## 2022-04-24 NOTE — Assessment & Plan Note (Signed)
Due for colonoscopy recall in April 2024

## 2022-04-24 NOTE — Assessment & Plan Note (Signed)
Most likely due [place].  Early otitis externa.  Treat with Ciprodex otic.

## 2022-04-24 NOTE — Assessment & Plan Note (Signed)
Fasting lipids ordered.  LDL 98.  Refill atorvastatin.

## 2022-04-24 NOTE — Assessment & Plan Note (Signed)
Last bone density January 2021.  Bone density ordered and she will call and schedule.  Continue getting adequate calcium in her diet, taking vitamin D supplement and weightbearing exercises.

## 2022-04-30 ENCOUNTER — Ambulatory Visit (INDEPENDENT_AMBULATORY_CARE_PROVIDER_SITE_OTHER): Payer: Medicare Other

## 2022-04-30 DIAGNOSIS — Z0000 Encounter for general adult medical examination without abnormal findings: Secondary | ICD-10-CM

## 2022-04-30 NOTE — Progress Notes (Signed)
Subjective:   Cassandra May is a 66 y.o. female who presents for Medicare Annual (Subsequent) preventive examination.  I connected with Cassandra May today by telephone and verified that I am speaking with the correct person using two identifiers. I discussed the limitations, risks, security and privacy concerns of performing an evaluation and management service by telephone and the availability of in person appointments. I also discussed with the patient that there may be a patient responsible charge related to this service. The patient expressed understanding and agreed to proceed. Location patient: home Location provider: Pietro May Persons participating in the visit: Cassandra May, Cassandra May.  Time Spent with patient on telephone encounter: 10 minutes  Review of Systems    No ROS. Medicare Wellness Telephone Visit. Additional risk factors are reflected in social history. Cardiac Risk Factors include: advanced age (>39mn, >>30women)     Objective:    There were no vitals filed for this visit. There is no height or weight on file to calculate BMI.     04/30/2022    8:41 AM 04/27/2021    9:31 AM  Advanced Directives  Does Patient Have a Medical Advance Directive? Yes Yes  Type of Advance Directive Living will Living will  Does patient want to make changes to medical advance directive? No - Patient declined No - Patient declined    Current Medications (verified) Outpatient Encounter Medications as of 04/30/2022  Medication Sig   atorvastatin (LIPITOR) 20 MG tablet Take 1 tablet (20 mg total) by mouth daily.   Multiple Vitamins-Iron (MULTIPLE VITAMIN/IRON PO) Take by mouth.   [DISCONTINUED] ciprofloxacin-hydrocortisone (CIPRO HC) OTIC suspension Place 3 drops into both ears 2 (two) times daily. Use for 7-10 days only.   No facility-administered encounter medications on file as of 04/30/2022.    Allergies (verified) Patient has no known allergies.    History: Past Medical History:  Diagnosis Date   Mixed hyperlipidemia    Past Surgical History:  Procedure Laterality Date   COLONOSCOPY     POLYPECTOMY     Family History  Problem Relation Age of Onset   Non-Hodgkin's lymphoma Mother        died at 758  Heart attack Father 625  Breast cancer Maternal Grandmother 543  Heart attack Brother 611  Diabetes Brother    Arthritis Brother    Kidney disease Son        PKD   Kidney disease Son        PKD   Colon cancer Neg Hx    Colon polyps Neg Hx    Esophageal cancer Neg Hx    Rectal cancer Neg Hx    Stomach cancer Neg Hx    Social History   Socioeconomic History   Marital status: Married    Spouse name: Not on file   Number of children: Not on file   Years of education: Not on file   Highest education level: Not on file  Occupational History   Not on file  Tobacco Use   Smoking status: Never   Smokeless tobacco: Never  Vaping Use   Vaping Use: Never used  Substance and Sexual Activity   Alcohol use: Yes    Comment: occassionally   Drug use: Never   Sexual activity: Not on file  Other Topics Concern   Not on file  Social History Narrative   Not on file   Social Determinants of Health   Financial Resource Strain: Low Risk  (  04/30/2022)   Overall Financial Resource Strain (CARDIA)    Difficulty of Paying Living Expenses: Not hard at all  Food Insecurity: No Food Insecurity (04/30/2022)   Hunger Vital Sign    Worried About Running Out of Food in the Last Year: Never true    Ran Out of Food in the Last Year: Never true  Transportation Needs: No Transportation Needs (04/30/2022)   PRAPARE - Cassandra May (Medical): No    Lack of Transportation (Non-Medical): No  Physical Activity: Sufficiently Active (04/30/2022)   Exercise Vital Sign    Days of Exercise per Week: 7 days    Minutes of Exercise per Session: 90 min  Stress: No Stress Concern Present (04/30/2022)   Cassandra May    Feeling of Stress : Not at all  Social Connections: Cassandra May (04/30/2022)   Social Connection and Isolation Panel [NHANES]    Frequency of Communication with Friends and Family: More than three times a week    Frequency of Social Gatherings with Friends and Family: More than three times a week    Attends Religious Services: More than 4 times per year    Active Member of [employer] or Organizations: Yes    Attends Music therapist: More than 4 times per year    Marital Status: Married    Tobacco Counseling Counseling given: Not Answered   Clinical Intake:  Pre-visit preparation completed: Yes  Pain : No/denies pain     Nutritional Risks: None Diabetes: No  How often do you need to have someone help you when you read instructions, pamphlets, or other written materials from your doctor or pharmacy?: 1 - Never What is the last grade level you completed in school?: bachelor's degree in science  [place]?: No  Information entered by :: Cassandra May, Cassandra May   Activities of Daily Living    04/30/2022    8:41 AM 04/26/2022    9:06 AM  In your present state of health, do you have any difficulty performing the following activities:  Hearing? 0 0  Vision? 0 0  Difficulty concentrating or making decisions? 0 0  Walking or climbing stairs? 0 0  Dressing or bathing? 0 0  Doing errands, shopping? 0 0  Preparing Food and eating ? N N  Using the Toilet? N N  In the past six months, have you accidently leaked urine? N N  Do you have problems with loss of bowel control? N N  Managing your Medications? N N  Managing your Finances? N N  Housekeeping or managing your Housekeeping? N N    Patient Care Team: Cassandra Rm, NP-C as PCP - General (Family Medicine)  Indicate any recent Medical Services you may have received from other than [facility] providers in the past year (date may be  approximate).     Assessment:   This is a routine wellness examination for Cassandra May.  Hearing/Vision screen Patient denied any hearing difficulty. No hearing aids. Patient does wear corrective lenses.  Dietary issues and exercise activities discussed: Current Exercise Habits: Home exercise routine, Type of exercise: Other - see comments;walking (biking, swimming and hiking), Time (Minutes): > 60, Frequency (Times/Week): 7, Weekly Exercise (Minutes/Week): 0, Intensity: Moderate, Exercise limited by: None identified   Goals Addressed               This Visit's Progress     Patient Stated (pt-stated)  She would like to continue to stay active and healthy.        Depression Screen    04/30/2022    8:40 AM 04/27/2021    9:30 AM 05/15/2020   10:54 AM 05/15/2019    8:32 AM  PHQ 2/9 Scores  PHQ - 2 Score 0 0 0 0    Fall Risk    04/30/2022    8:41 AM 04/26/2022    9:06 AM 04/27/2021    9:30 AM 05/15/2020   10:53 AM 05/15/2019    8:32 AM  Fall Risk   Falls in the past year? 0 0 0 0 1  Number falls in past yr: 0 0 0 0 1  Injury with Fall? 0 0 0 0 1  Risk for fall due to : No Fall Risks  No Fall Risks No Fall Risks   Follow up Falls evaluation completed  Falls evaluation completed Falls evaluation completed     Hermosa:  Any stairs in or around the home? Yes  If so, are there any without handrails? No  Home free of loose throw rugs in walkways, pet beds, electrical cords, etc? Yes  Adequate lighting in your home to reduce risk of falls? Yes   ASSISTIVE DEVICES UTILIZED TO PREVENT FALLS:  Life alert? No  Use of a cane, walker or w/c? No  Grab bars in the bathroom? No  Shower chair or bench in shower? No  Elevated toilet seat or a handicapped toilet? No   TIMED UP AND GO:  Was the test performed? No .  Length of time to ambulate 10 feet: N/A sec.   Patient stated that she has no issues with gait or balance; does not use  any assistive devices.  Cognitive Function:  Patient is cogitatively intact.      04/30/2022    8:43 AM  6CIT Screen  What Year? 0 points  What month? 0 points  What time? 0 points  Count back from 20 0 points  Months in reverse 0 points  Repeat phrase 0 points  Total Score 0 points    Immunizations Immunization History  Administered Date(s) Administered   Fluad Quad(high Dose 65+) 05/15/2019, 05/15/2020   Hepatitis A 11/22/1995, 08/10/2016   Hepatitis B 05/24/1995, 06/28/1995   Influenza-Unspecified 04/18/2012, 08/10/2016, 07/27/2018, 04/15/2021   PFIZER(Purple Top)SARS-COV-2 Vaccination 08/24/2019, 09/14/2019, 04/28/2020, 11/02/2020   Pfizer Covid-19 Vaccine Bivalent Booster 77yr & up 04/15/2021   Tdap 08/10/2016   Varicella 05/19/2015    TDAP status: Up to date  Flu Vaccine status: Due, Education has been provided regarding the importance of this vaccine. Advised may receive this vaccine at local pharmacy or Health Dept. Aware to provide a copy of the vaccination record if obtained from local pharmacy or Health Dept. Verbalized acceptance and understanding.  Pneumococcal vaccine status: Due, Education has been provided regarding the importance of this vaccine. Advised may receive this vaccine at local pharmacy or Health Dept. Aware to provide a copy of the vaccination record if obtained from local pharmacy or Health Dept. Verbalized acceptance and understanding.  Covid-19 vaccine status: Completed vaccines  Qualifies for Shingles Vaccine? Yes   Zostavax completed No   Shingrix Completed?: No.    Education has been provided regarding the importance of this vaccine. Patient has been advised to call insurance company to determine out of pocket expense if they have not yet received this vaccine. Advised may also receive vaccine at local pharmacy or Health Dept.  Verbalized acceptance and understanding.  Screening Tests Health Maintenance  Topic Date Due   Pneumonia Vaccine  55+ Years old (1 - PCV) Never done   COVID-19 Vaccine (6 - Pfizer risk series) 05/09/2022 (Originally 06/10/2021)   INFLUENZA VACCINE  06/12/2022 (Originally 03/02/2022)   Zoster Vaccines- Shingrix (1 of 2) 07/23/2022 (Originally 01/01/1975)   COLONOSCOPY (Pts 45-86yr Insurance coverage will need to be confirmed)  11/05/2022   MAMMOGRAM  11/05/2023   TETANUS/TDAP  08/10/2026   DEXA SCAN  Completed   Hepatitis C Screening  Completed   HPV VACCINES  Aged Out    Health Maintenance  Health Maintenance Due  Topic Date Due   Pneumonia Vaccine 66 Years old (1 - PCV) Never done    Colorectal cancer screening: Type of screening: Colonoscopy. Completed 11/05/2019. Repeat every 3 years  Mammogram status: Completed 11/04/2021. Repeat every year  Bone Density status: Completed 08/07/2019. Results reflect: Bone density results: OSTEOPENIA. Repeat every n/a years.  Lung Cancer Screening: (Low Dose CT Chest recommended if Age 68-80years, 30 pack-year currently smoking OR have quit w/in 15years.) does not qualify.   Lung Cancer Screening Referral: N/A  Additional Screening:  Hepatitis C Screening: does qualify; Completed 05/15/2019  Vision Screening: Recommended annual ophthalmology exams for early detection of glaucoma and other disorders of the eye. Is the patient up to date with their annual eye exam?  Yes  Who is the provider or what is the name of the [facility] which the patient attends annual [facility] exams[facility] not [facility] a provider, would they like to be referred to a provider to establish care? No .   Dental Screening: Recommended annual dental exams for proper oral hygiene  Community Resource Referral / Chronic Care Management: CRR required this visit?  No   CCM required this visit?  No      Plan:     I have personally reviewed and noted the following in the patient's chart:   Medical and social history Use of alcohol, tobacco or illicit drugs  Current  medications and supplements including opioid prescriptions. Patient is not currently taking opioid prescriptions. Functional ability and status Nutritional status Physical activity Advanced directives List of other physicians Hospitalizations, surgeries, and ER visits in previous 12 months Vitals Screenings to include cognitive, depression, and falls Referrals and appointments  In addition, I have reviewed and discussed with patient certain preventive protocols, quality metrics, and best practice recommendations. A written personalized care plan for preventive services as well as general preventive health recommendations were provided to patient.     HRossie Muskrat [facility]  04/30/2022   Nurse Notes:   Patient plans to get Pneumonia, Flu and new COVID booster at her pharmacy.

## 2022-04-30 NOTE — Patient Instructions (Signed)
It was great speaking with you today!  Please schedule your next Medicare Wellness Visit with your Nurse Health Advisor in 1 year by calling [phone]. 

## 2022-05-19 ENCOUNTER — Other Ambulatory Visit: Payer: Self-pay | Admitting: Family Medicine

## 2022-05-19 DIAGNOSIS — Z1231 Encounter for screening mammogram for malignant neoplasm of breast: Secondary | ICD-10-CM

## 2022-07-09 ENCOUNTER — Ambulatory Visit: Payer: Medicare Other | Admitting: Family Medicine

## 2022-09-22 ENCOUNTER — Encounter: Payer: Self-pay | Admitting: Gastroenterology

## 2022-10-13 DIAGNOSIS — L814 Other melanin hyperpigmentation: Secondary | ICD-10-CM | POA: Diagnosis not present

## 2022-10-13 DIAGNOSIS — L821 Other seborrheic keratosis: Secondary | ICD-10-CM | POA: Diagnosis not present

## 2022-10-13 DIAGNOSIS — D225 Melanocytic nevi of trunk: Secondary | ICD-10-CM | POA: Diagnosis not present

## 2022-10-13 DIAGNOSIS — L718 Other rosacea: Secondary | ICD-10-CM | POA: Diagnosis not present

## 2022-10-17 ENCOUNTER — Other Ambulatory Visit: Payer: Self-pay | Admitting: Family Medicine

## 2022-10-17 DIAGNOSIS — E782 Mixed hyperlipidemia: Secondary | ICD-10-CM

## 2022-11-08 ENCOUNTER — Ambulatory Visit
Admission: RE | Admit: 2022-11-08 | Discharge: 2022-11-08 | Disposition: A | Discharge status: Home / Self Care | Payer: Medicare Other | Source: Ambulatory Visit | Attending: Family Medicine | Admitting: Family Medicine

## 2022-11-08 DIAGNOSIS — Z1231 Encounter for screening mammogram for malignant neoplasm of breast: Secondary | ICD-10-CM | POA: Diagnosis not present

## 2023-01-15 ENCOUNTER — Other Ambulatory Visit: Payer: Self-pay | Admitting: Family Medicine

## 2023-01-15 DIAGNOSIS — E782 Mixed hyperlipidemia: Secondary | ICD-10-CM

## 2023-01-17 ENCOUNTER — Other Ambulatory Visit: Payer: Self-pay | Admitting: Medical

## 2023-01-17 DIAGNOSIS — E782 Mixed hyperlipidemia: Secondary | ICD-10-CM

## 2023-02-15 IMAGING — MG MM DIGITAL SCREENING BILAT W/ TOMO AND CAD
8 series · 9 of 24 positions shown · non-contrast
Comparison: Previous exam(s).

CLINICAL DATA: Screening.

EXAM:
DIGITAL SCREENING BILATERAL MAMMOGRAM WITH TOMOSYNTHESIS AND CAD
TECHNIQUE: Bilateral screening digital craniocaudal and mediolateral oblique
mammograms were obtained. Bilateral screening digital breast
tomosynthesis was performed. The images were evaluated with
computer-aided detection.

[L CC synth-2D]
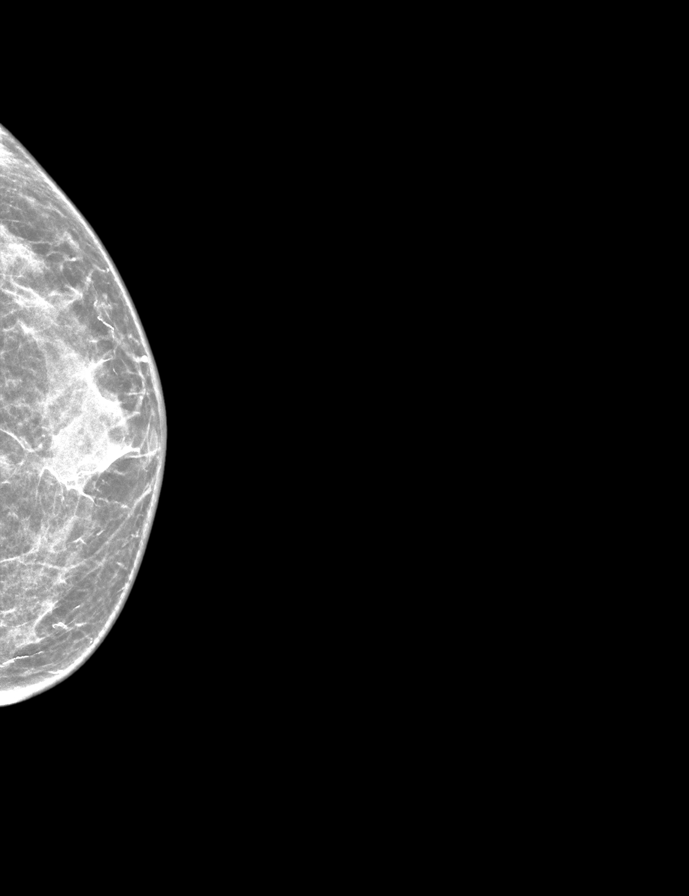

[R MLO synth-2D]
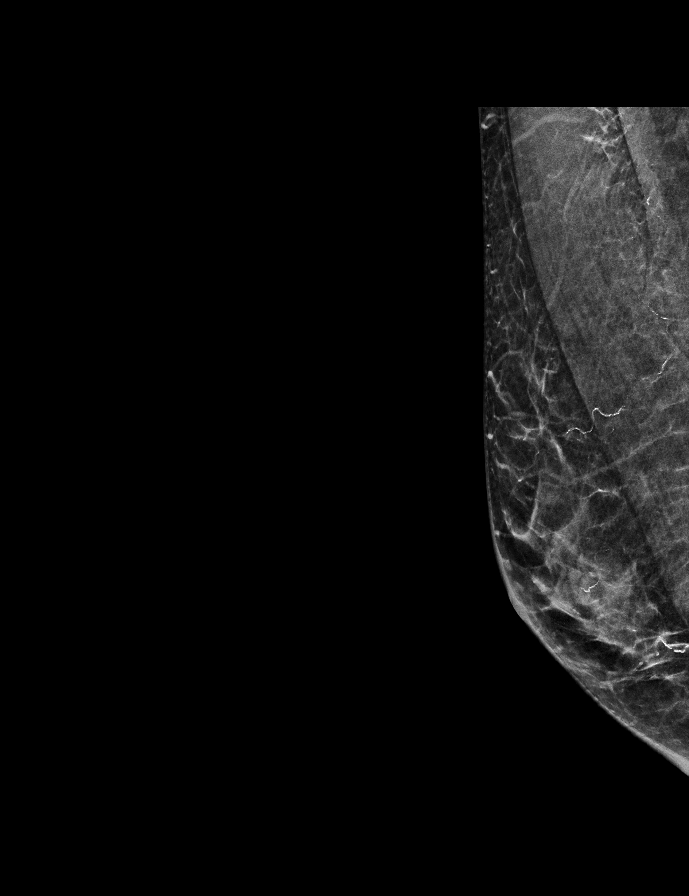

[L MLO synth-2D]
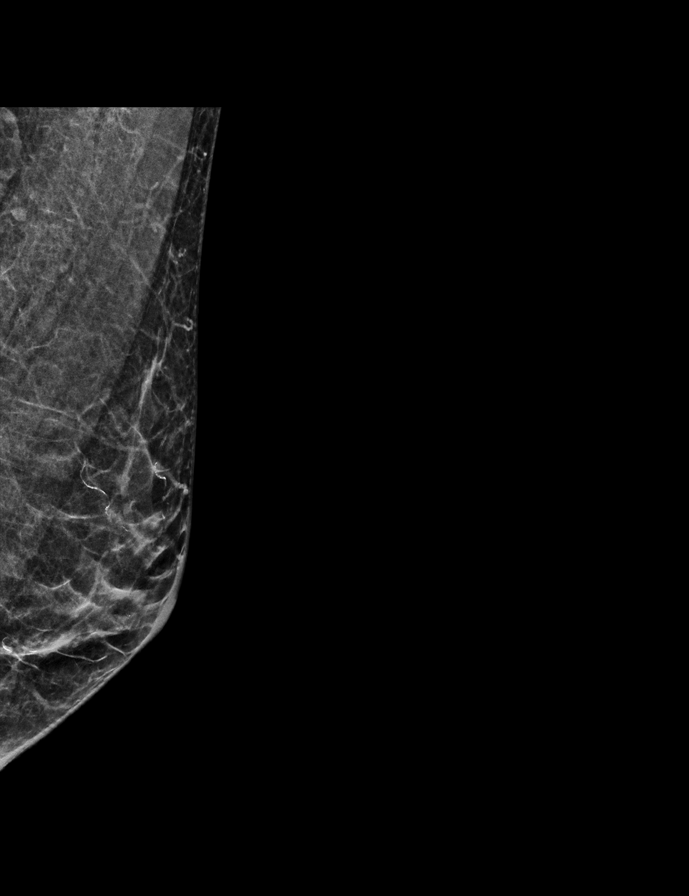

[R CC synth-2D]
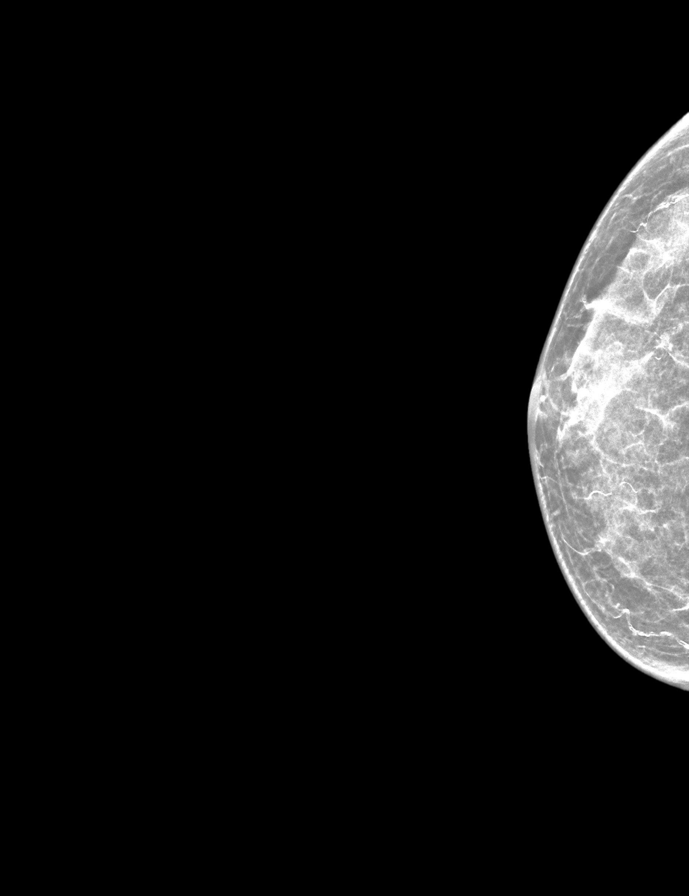

[L CC tomo · 2 of 35 frames shown]
[frame 12/35]
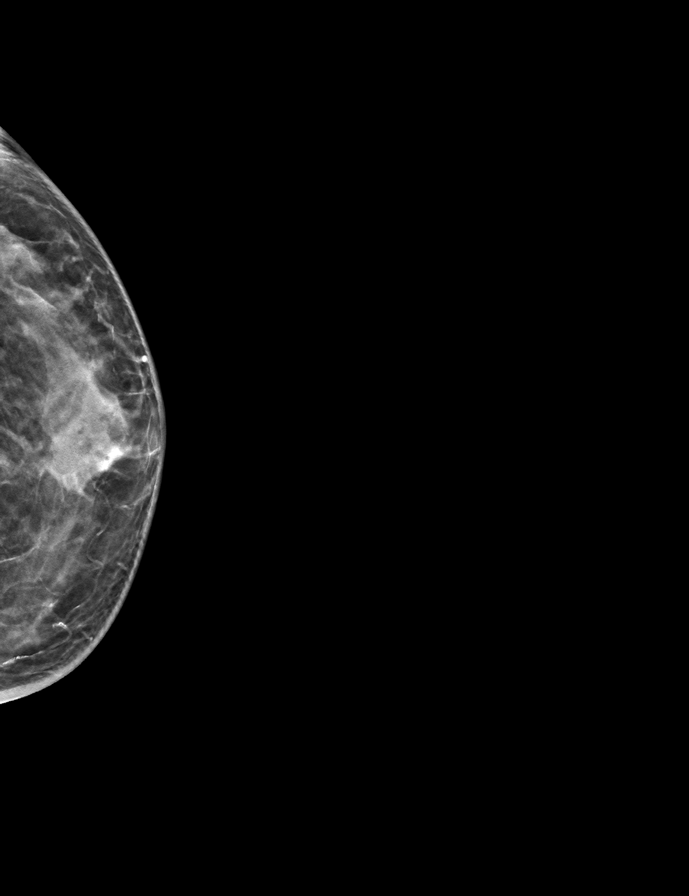
[frame 18/35]
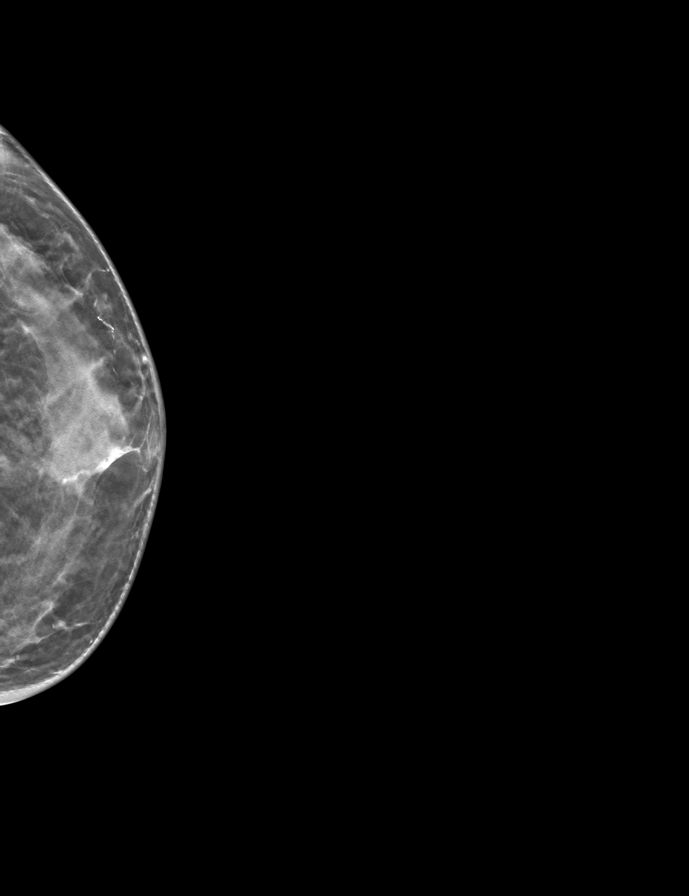

[L MLO tomo · tomo slice 21/40.0]
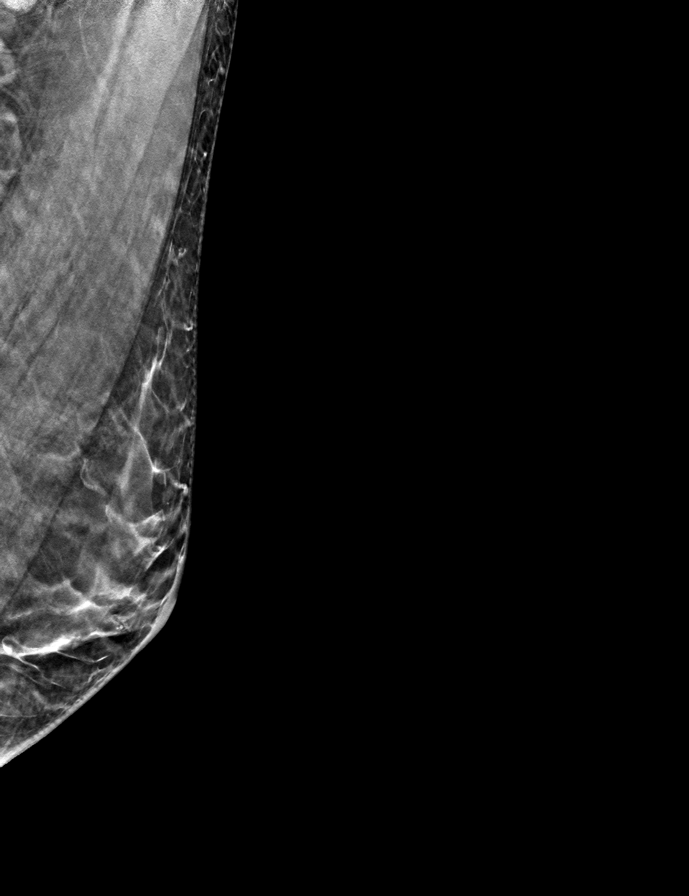

[R CC tomo · tomo slice 17/33.0]
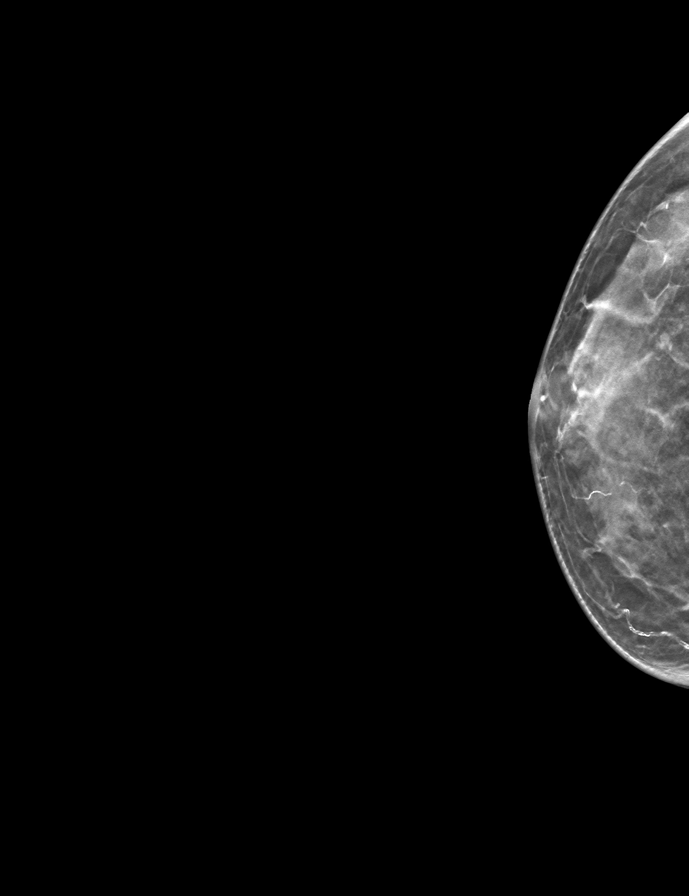

[R MLO tomo · tomo slice 22/43.0]
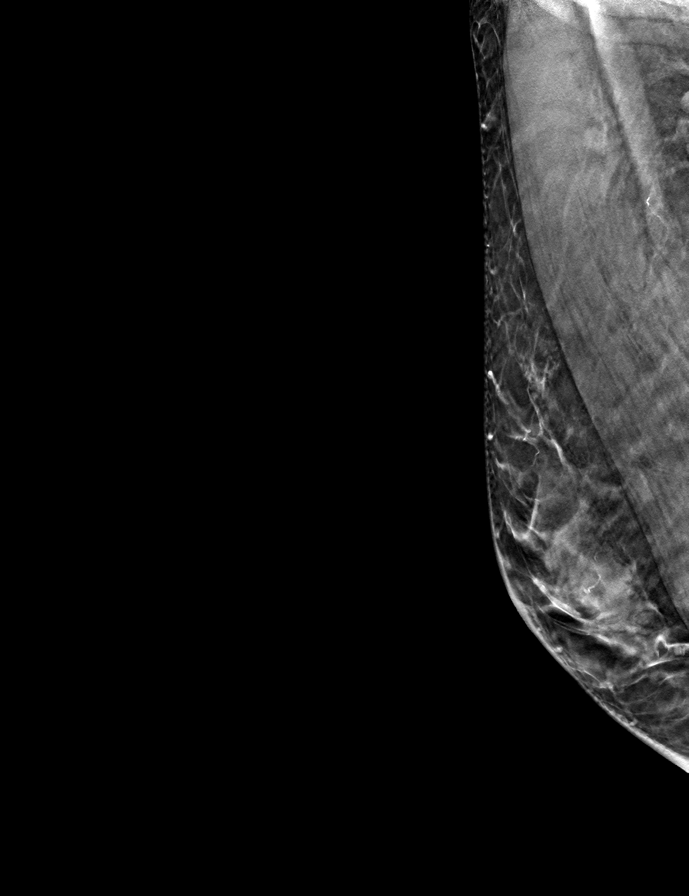

[9 of 24 positions shown; findings below may reference images not displayed]

ACR Breast Density Category c: The breast tissue is heterogeneously
dense, which may obscure small masses.
FINDINGS: There are no findings suspicious for malignancy.
IMPRESSION: No mammographic evidence of malignancy. A result letter of this
screening mammogram will be mailed directly to the patient.

RECOMMENDATION:
Screening mammogram in one year. (Code:Q3-[id number])

BI-RADS CATEGORY  1: Negative.

## 2023-04-04 DIAGNOSIS — M25519 Pain in unspecified shoulder: Secondary | ICD-10-CM | POA: Diagnosis not present

## 2023-04-06 ENCOUNTER — Ambulatory Visit (INDEPENDENT_AMBULATORY_CARE_PROVIDER_SITE_OTHER)
Admission: RE | Admit: 2023-04-06 | Discharge: 2023-04-06 | Disposition: A | Discharge status: Home / Self Care | Payer: Medicare Other | Source: Ambulatory Visit | Attending: Family Medicine | Admitting: Family Medicine

## 2023-04-06 DIAGNOSIS — M25519 Pain in unspecified shoulder: Secondary | ICD-10-CM | POA: Diagnosis not present

## 2023-04-06 DIAGNOSIS — E2839 Other primary ovarian failure: Secondary | ICD-10-CM

## 2023-04-06 DIAGNOSIS — M8580 Other specified disorders of bone density and structure, unspecified site: Secondary | ICD-10-CM

## 2023-04-27 DIAGNOSIS — M25519 Pain in unspecified shoulder: Secondary | ICD-10-CM | POA: Diagnosis not present

## 2023-05-07 DIAGNOSIS — M25519 Pain in unspecified shoulder: Secondary | ICD-10-CM | POA: Diagnosis not present

## 2023-05-08 ENCOUNTER — Other Ambulatory Visit: Payer: Self-pay | Admitting: Family Medicine

## 2023-05-08 DIAGNOSIS — E782 Mixed hyperlipidemia: Secondary | ICD-10-CM

## 2023-05-09 ENCOUNTER — Other Ambulatory Visit: Payer: Self-pay

## 2023-05-09 DIAGNOSIS — M25519 Pain in unspecified shoulder: Secondary | ICD-10-CM | POA: Diagnosis not present

## 2023-05-14 ENCOUNTER — Other Ambulatory Visit: Payer: Self-pay | Admitting: Family Medicine

## 2023-05-14 DIAGNOSIS — E782 Mixed hyperlipidemia: Secondary | ICD-10-CM

## 2023-05-25 DIAGNOSIS — M25519 Pain in unspecified shoulder: Secondary | ICD-10-CM | POA: Diagnosis not present

## 2023-05-26 ENCOUNTER — Encounter: Payer: Self-pay | Admitting: Family Medicine

## 2023-05-26 NOTE — Telephone Encounter (Signed)
We do not do pre authorizations for PT... unsure of why they told her to contact us, do we need to refer her?

## 2023-05-30 DIAGNOSIS — M25519 Pain in unspecified shoulder: Secondary | ICD-10-CM | POA: Diagnosis not present

## 2023-06-01 DIAGNOSIS — M25519 Pain in unspecified shoulder: Secondary | ICD-10-CM | POA: Diagnosis not present

## 2023-06-08 ENCOUNTER — Ambulatory Visit: Payer: Medicare Other | Admitting: Family Medicine

## 2023-06-08 ENCOUNTER — Encounter: Payer: Self-pay | Admitting: Family Medicine

## 2023-06-08 VITALS — BP 136/78 | HR 72 | Temp 97.6°F | Ht 63.25 in | Wt 124.0 lb

## 2023-06-08 DIAGNOSIS — Z8249 Family history of ischemic heart disease and other diseases of the circulatory system: Secondary | ICD-10-CM

## 2023-06-08 DIAGNOSIS — E782 Mixed hyperlipidemia: Secondary | ICD-10-CM

## 2023-06-08 DIAGNOSIS — Z0001 Encounter for general adult medical examination with abnormal findings: Secondary | ICD-10-CM | POA: Diagnosis not present

## 2023-06-08 DIAGNOSIS — M8580 Other specified disorders of bone density and structure, unspecified site: Secondary | ICD-10-CM

## 2023-06-08 DIAGNOSIS — Z136 Encounter for screening for cardiovascular disorders: Secondary | ICD-10-CM | POA: Diagnosis not present

## 2023-06-08 DIAGNOSIS — E2839 Other primary ovarian failure: Secondary | ICD-10-CM

## 2023-06-08 DIAGNOSIS — Z860100 Personal history of colon polyps, unspecified: Secondary | ICD-10-CM

## 2023-06-08 DIAGNOSIS — M25519 Pain in unspecified shoulder: Secondary | ICD-10-CM | POA: Diagnosis not present

## 2023-06-08 LAB — T4, FREE: Free T4: 0.86 ng/dL (ref 0.60–1.60)

## 2023-06-08 LAB — COMPREHENSIVE METABOLIC PANEL
ALT: 17 U/L (ref 0–35)
AST: 28 U/L (ref 0–37)
Albumin: 4.2 g/dL (ref 3.5–5.2)
Alkaline Phosphatase: 81 U/L (ref 39–117)
BUN: 13 mg/dL (ref 6–23)
CO2: 27 meq/L (ref 19–32)
Calcium: 9.5 mg/dL (ref 8.4–10.5)
Chloride: 105 meq/L (ref 96–112)
Creatinine, Ser: 0.71 mg/dL (ref 0.40–1.20)
GFR: 86.75 mL/min (ref >60.00)
Glucose, Bld: 96 mg/dL (ref 70–99)
Potassium: 3.7 meq/L (ref 3.5–5.1)
Sodium: 139 meq/L (ref 135–145)
Total Bilirubin: 0.4 mg/dL (ref 0.2–1.2)
Total Protein: 7.2 g/dL (ref 6.0–8.3)

## 2023-06-08 LAB — CBC WITH DIFFERENTIAL/PLATELET
Basophils Absolute: 0 10*3/uL (ref 0.0–0.1)
Basophils Relative: 0.6 % (ref 0.0–3.0)
Eosinophils Absolute: 0.1 10*3/uL (ref 0.0–0.7)
Eosinophils Relative: 1.8 % (ref 0.0–5.0)
HCT: 38 % (ref 36.0–46.0)
Hemoglobin: 12.2 g/dL (ref 12.0–15.0)
Lymphocytes Relative: 21.7 % (ref 12.0–46.0)
Lymphs Abs: 1.7 10*3/uL (ref 0.7–4.0)
MCHC: 32.1 g/dL (ref 30.0–36.0)
MCV: 87.4 fL (ref 78.0–100.0)
Monocytes Absolute: 0.7 10*3/uL (ref 0.1–1.0)
Monocytes Relative: 9.3 % (ref 3.0–12.0)
Neutro Abs: 5.1 10*3/uL (ref 1.4–7.7)
Neutrophils Relative %: 66.6 % (ref 43.0–77.0)
Platelets: 269 10*3/uL (ref 150.0–400.0)
RBC: 4.35 Mil/uL (ref 3.87–5.11)
RDW: 13.8 % (ref 11.5–15.5)
WBC: 7.6 10*3/uL (ref 4.0–10.5)

## 2023-06-08 LAB — LIPID PANEL
Cholesterol: 177 mg/dL (ref 0–200)
HDL: 72.9 mg/dL (ref >39.00)
LDL Cholesterol: 92 mg/dL (ref 0–99)
NonHDL: 103.94
Total CHOL/HDL Ratio: 2
Triglycerides: 59 mg/dL (ref 0.0–149.0)
VLDL: 11.8 mg/dL (ref 0.0–40.0)

## 2023-06-08 LAB — TSH: TSH: 0.85 u[IU]/mL (ref 0.35–5.50)

## 2023-06-08 LAB — VITAMIN D 25 HYDROXY (VIT D DEFICIENCY, FRACTURES): VITD: 42.41 ng/mL (ref 30.00–100.00)

## 2023-06-08 NOTE — Assessment & Plan Note (Signed)
Preventive health care reviewed.  Counseling on healthy lifestyle including diet and exercise.  Recommend regular dental and eye exams.  Immunizations reviewed.  Discussed safety.  

## 2023-06-08 NOTE — Assessment & Plan Note (Signed)
Continue getting adequate calcium in her diet, taking vitamin D supplement and weightbearing exercises.

## 2023-06-08 NOTE — Assessment & Plan Note (Addendum)
Fasting lipids ordered. Continue atorvastatin. CT coronary calcium score ordered.

## 2023-06-08 NOTE — Assessment & Plan Note (Signed)
Due for colonoscopy and she will call to schedule.

## 2023-06-08 NOTE — Progress Notes (Signed)
Complete physical exam  Patient: Cassandra May   DOB: [birth year]   67 y.o. Female  MRN: [id number]  Subjective:    Chief Complaint  Patient presents with   Annual Exam    fasting   She is here for a complete physical exam.  Donates blood regularly. Last time she donated blood in October her BP was elevated.   She has had her first shingles vaccine at [facility] had heart attack 61    Health Maintenance  Topic Date Due   Zoster (Shingles) Vaccine (1 of 2) 01/01/1973   Colon Cancer Screening  11/05/2022   Medicare Annual Wellness Visit  05/01/2023   COVID-19 Vaccine (6 - 2023-24 season) 06/24/2023*   Mammogram  11/07/2024   DTaP/Tdap/Td vaccine (2 - Td or Tdap) 08/10/2026   Pneumonia Vaccine  Completed   Flu Shot  Completed   DEXA scan (bone density measurement)  Completed   Hepatitis C Screening  Completed   HPV Vaccine  Aged Out  *Topic was postponed. The date shown is not the original due date.    Wears seatbelt always, uses sunscreen, smoke detectors in home and functioning, does not text while driving, feels safe in home environment.  Depression screening:    06/08/2023    9:46 AM 04/30/2022    8:40 AM 04/27/2021    9:30 AM  Depression screen PHQ 2/9  Decreased Interest 0 0 0  Down, Depressed, Hopeless 0 0 0  PHQ - 2 Score 0 0 0   Anxiety Screening:     No data to display          Vision:Within last year and Dental: No current dental problems and Receives regular dental care  Patient Active Problem List   Diagnosis Date Noted   Encounter for general adult medical examination with abnormal findings 06/08/2023   Screening for ischemic heart disease 06/08/2023   Vertigo 04/24/2022   Inflammation of left ear canal 04/24/2022   Osteopenia 05/15/2020   Mixed hyperlipidemia 05/15/2019   Estrogen deficiency 05/15/2019   Family history of heart disease in brother 05/15/2019   History of colon polyps 05/15/2019   Past Medical History:   Diagnosis Date   Colon polyps    Mixed hyperlipidemia    Past Surgical History:  Procedure Laterality Date   COLONOSCOPY     POLYPECTOMY     Social History   Tobacco Use   Smoking status: Never   Smokeless tobacco: Never  Vaping Use   Vaping status: Never Used  Substance Use Topics   Alcohol use: Yes    Comment: occassionally   Drug use: Never      Patient Care Team: Avanell Shackleton, NP-C as PCP - General (Family Medicine)   Outpatient Medications Prior to Visit  Medication Sig   atorvastatin (LIPITOR) 20 MG tablet Take 1 tablet (20 mg total) by mouth daily. Please schedule office visit for further refills.   Multiple Vitamins-Iron (MULTIPLE VITAMIN/IRON PO) Take by mouth.   No facility-administered medications prior to visit.    Review of Systems  Constitutional:  Negative for chills, fever and malaise/fatigue.  HENT:  Negative for congestion, ear pain, sinus pain and sore throat.   Eyes:  Negative for blurred vision, double vision and pain.  Respiratory:  Negative for cough, shortness of breath and wheezing.   Cardiovascular:  Negative for chest pain, palpitations and leg swelling.  Gastrointestinal:  Negative for abdominal pain, constipation, diarrhea, nausea and vomiting.  Genitourinary:  Negative for dysuria, frequency and urgency.  Musculoskeletal:  Negative for back pain, joint pain and myalgias.  Skin:  Negative for rash.  Neurological:  Negative for dizziness, tingling, focal weakness and headaches.  Psychiatric/Behavioral:  Negative for depression and memory loss. The patient is not nervous/anxious.        Objective:    BP 136/78 (BP Location: Left Arm, Patient Position: Sitting, Cuff Size: Normal)   Pulse 72   Temp 97.6 F (36.4 C) (Temporal)   Ht 5' 3.25" (1.607 m)   Wt 124 lb (56.2 kg)   SpO2 98%   BMI 21.79 kg/m  BP Readings from Last 3 Encounters:  06/08/23 136/78  04/23/22 134/86  04/27/21 136/86   Wt Readings from Last 3 Encounters:   06/08/23 124 lb (56.2 kg)  04/23/22 120 lb (54.4 kg)  04/27/21 117 lb 9.6 oz (53.3 kg)    Physical Exam Constitutional:      General: She is not in acute distress.    Appearance: She is not ill-appearing.  HENT:     Right Ear: Tympanic membrane, ear canal and external ear normal.     Left Ear: Tympanic membrane, ear canal and external ear normal.     Nose: Nose normal.     Mouth/Throat:     Mouth: Mucous membranes are moist.     Pharynx: Oropharynx is clear.  Eyes:     Extraocular Movements: Extraocular movements intact.     Conjunctiva/sclera: Conjunctivae normal.     Pupils: Pupils are equal, round, and reactive to light.  Neck:     Thyroid: No thyroid mass, thyromegaly or thyroid tenderness.  Cardiovascular:     Rate and Rhythm: Normal rate and regular rhythm.     Pulses: Normal pulses.     Heart sounds: Normal heart sounds.  Pulmonary:     Effort: Pulmonary effort is normal.     Breath sounds: Normal breath sounds.  Abdominal:     General: Bowel sounds are normal.     Palpations: Abdomen is soft.     Tenderness: There is no abdominal tenderness. There is no right CVA tenderness, left CVA tenderness, guarding or rebound.  Musculoskeletal:        General: Normal range of motion.     Cervical back: Normal range of motion and neck supple. No tenderness.     Right lower leg: No edema.     Left lower leg: No edema.  Lymphadenopathy:     Cervical: No cervical adenopathy.  Skin:    General: Skin is warm and dry.     Findings: No lesion or rash.  Neurological:     General: No focal deficit present.     Mental Status: She is alert and oriented to person, place, and time.     Cranial Nerves: No cranial nerve deficit.     Sensory: No sensory deficit.     Motor: No weakness.     Gait: Gait normal.  Psychiatric:        Mood and Affect: Mood normal.        Behavior: Behavior normal.        Thought Content: Thought content normal.      Results for orders placed or  performed in visit on 06/08/23  T4, free  Result Value Ref Range   Free T4 0.86 0.60 - 1.60 ng/dL  VITAMIN D 25 Hydroxy (Vit-D Deficiency, Fractures)  Result Value Ref Range   VITD 42.41 30.00 - 100.00 ng/mL  CBC  with Differential/Platelet  Result Value Ref Range   WBC 7.6 4.0 - 10.5 K/uL   RBC 4.35 3.87 - 5.11 Mil/uL   Hemoglobin 12.2 12.0 - 15.0 g/dL   HCT 37.6 28.3 - 15.1 %   MCV 87.4 78.0 - 100.0 fl   MCHC 32.1 30.0 - 36.0 g/dL   RDW 76.1 60.7 - 37.1 %   Platelets 269.0 150.0 - 400.0 K/uL   Neutrophils Relative % 66.6 43.0 - 77.0 %   Lymphocytes Relative 21.7 12.0 - 46.0 %   Monocytes Relative 9.3 3.0 - 12.0 %   Eosinophils Relative 1.8 0.0 - 5.0 %   Basophils Relative 0.6 0.0 - 3.0 %   Neutro Abs 5.1 1.4 - 7.7 K/uL   Lymphs Abs 1.7 0.7 - 4.0 K/uL   Monocytes Absolute 0.7 0.1 - 1.0 K/uL   Eosinophils Absolute 0.1 0.0 - 0.7 K/uL   Basophils Absolute 0.0 0.0 - 0.1 K/uL  Comprehensive metabolic panel  Result Value Ref Range   Sodium 139 135 - 145 mEq/L   Potassium 3.7 3.5 - 5.1 mEq/L   Chloride 105 96 - 112 mEq/L   CO2 27 19 - 32 mEq/L   Glucose, Bld 96 70 - 99 mg/dL   BUN 13 6 - 23 mg/dL   Creatinine, Ser 0.62 0.40 - 1.20 mg/dL   Total Bilirubin 0.4 0.2 - 1.2 mg/dL   Alkaline Phosphatase 81 39 - 117 U/L   AST 28 0 - 37 U/L   ALT 17 0 - 35 U/L   Total Protein 7.2 6.0 - 8.3 g/dL   Albumin 4.2 3.5 - 5.2 g/dL   GFR 69.48 >54.62 mL/min   Calcium 9.5 8.4 - 10.5 mg/dL  Lipid panel  Result Value Ref Range   Cholesterol 177 0 - 200 mg/dL   Triglycerides 70.3 0.0 - 149.0 mg/dL   HDL 50.09 >38.18 mg/dL   VLDL 29.9 0.0 - 37.1 mg/dL   LDL Cholesterol 92 0 - 99 mg/dL   Total CHOL/HDL Ratio 2    NonHDL 103.94   TSH  Result Value Ref Range   TSH 0.85 0.35 - 5.50 uIU/mL      Assessment & Plan:    Routine Health Maintenance and Physical Exam  Problem List Items Addressed This Visit       Musculoskeletal and Integument   Osteopenia    Continue getting adequate  calcium in her diet, taking vitamin D supplement and weightbearing exercises.      Relevant Orders   TSH (Completed)   Comprehensive metabolic panel (Completed)   CBC with Differential/Platelet (Completed)   VITAMIN D 25 Hydroxy (Vit-D Deficiency, Fractures) (Completed)   T4, free (Completed)     Other   Encounter for general adult medical examination with abnormal findings - Primary    Preventive health care reviewed.  Counseling on healthy lifestyle including diet and exercise.  Recommend regular dental and eye exams.  Immunizations reviewed.  Discussed safety.       Relevant Orders   EKG 12-Lead (Completed)   Estrogen deficiency   Relevant Orders   VITAMIN D 25 Hydroxy (Vit-D Deficiency, Fractures) (Completed)   Family history of heart disease in brother   Relevant Orders   Lipid panel (Completed)   CT CARDIAC SCORING ([facility] LOCATIONS ONLY)   EKG 12-Lead (Completed)   History of colon polyps    Due for colonoscopy and she will call to schedule.       Mixed hyperlipidemia    Fasting lipids  ordered. Continue atorvastatin. CT coronary calcium score ordered.       Relevant Orders   Lipid panel (Completed)   Comprehensive metabolic panel (Completed)   CBC with Differential/Platelet (Completed)   CT CARDIAC SCORING ([facility] LOCATIONS ONLY)   Screening for ischemic heart disease    EKG shows sinus bradycardia, rate 59, no acute ST-T wave changes.       Relevant Orders   CT CARDIAC SCORING ([facility] LOCATIONS ONLY)   EKG 12-Lead (Completed)    Return for pending labs.     Hetty Blend, NP-C

## 2023-06-08 NOTE — Assessment & Plan Note (Signed)
EKG shows sinus bradycardia, rate 59, no acute ST-T wave changes.

## 2023-06-08 NOTE — Patient Instructions (Addendum)
Check your BP at home. Let me know if your readings are consistently higher than 130/80   Remember to call and schedule your colonoscopy.

## 2023-06-15 DIAGNOSIS — M25519 Pain in unspecified shoulder: Secondary | ICD-10-CM | POA: Diagnosis not present

## 2023-06-19 ENCOUNTER — Other Ambulatory Visit: Payer: Self-pay | Admitting: Family Medicine

## 2023-06-19 DIAGNOSIS — E782 Mixed hyperlipidemia: Secondary | ICD-10-CM

## 2023-06-21 ENCOUNTER — Other Ambulatory Visit: Payer: Self-pay | Admitting: Medical Genetics

## 2023-06-21 DIAGNOSIS — Z006 Encounter for examination for normal comparison and control in clinical research program: Secondary | ICD-10-CM

## 2023-07-07 ENCOUNTER — Ambulatory Visit
Admission: RE | Admit: 2023-07-07 | Discharge: 2023-07-07 | Disposition: A | Discharge status: Home / Self Care | Payer: No Typology Code available for payment source | Source: Ambulatory Visit | Attending: Family Medicine | Admitting: Family Medicine

## 2023-07-07 DIAGNOSIS — E782 Mixed hyperlipidemia: Secondary | ICD-10-CM

## 2023-07-07 DIAGNOSIS — Z136 Encounter for screening for cardiovascular disorders: Secondary | ICD-10-CM

## 2023-07-07 DIAGNOSIS — Z8249 Family history of ischemic heart disease and other diseases of the circulatory system: Secondary | ICD-10-CM

## 2023-07-08 ENCOUNTER — Encounter: Payer: Self-pay | Admitting: Family Medicine

## 2023-07-08 ENCOUNTER — Other Ambulatory Visit: Payer: Self-pay | Admitting: Family Medicine

## 2023-07-08 DIAGNOSIS — I700 Atherosclerosis of aorta: Secondary | ICD-10-CM | POA: Insufficient documentation

## 2023-07-08 DIAGNOSIS — E785 Hyperlipidemia, unspecified: Secondary | ICD-10-CM

## 2023-07-08 DIAGNOSIS — R931 Abnormal findings on diagnostic imaging of heart and coronary circulation: Secondary | ICD-10-CM | POA: Insufficient documentation

## 2023-07-08 HISTORY — DX: Atherosclerosis of aorta: I70.0

## 2023-07-08 MED ORDER — ROSUVASTATIN CALCIUM 20 MG PO TABS: 20.0000 mg | ORAL_TABLET | Freq: Every day | ORAL | 1 refills | Status: DC

## 2023-07-08 NOTE — Progress Notes (Signed)
Her CT coronary calcium score is 40.  This is an elevated score.  It also showed that she has aortic atherosclerosis which is plaque buildup in her coronary arteries.  Now that we know this, her LDL goal needs to be less than 70 and her last LDL was 92 (LDL is the bad cholesterol).  I recommend switching her from Lipitor to Crestor which could be more effective at lowering her cholesterol.  If she okay with this?  I also recommend cutting back on saturated fats, fried food and cutting out meat some meals.  Make sure to  get plenty of fiber and eat fish such as salmon at least once or twice a week.  Follow-up with me in 6 to 8 weeks after switching the medication if she is okay with this.

## 2023-07-20 ENCOUNTER — Other Ambulatory Visit (HOSPITAL_COMMUNITY): Payer: Medicare Other

## 2023-07-21 ENCOUNTER — Ambulatory Visit (AMBULATORY_SURGERY_CENTER): Payer: Medicare Other | Admitting: *Deleted

## 2023-07-21 VITALS — Ht 63.25 in | Wt 120.0 lb

## 2023-07-21 DIAGNOSIS — Z860100 Personal history of colon polyps, unspecified: Secondary | ICD-10-CM

## 2023-07-21 MED ORDER — NA SULFATE-K SULFATE-MG SULF 17.5-3.13-1.6 GM/177ML PO SOLN: 1.0000 | Freq: Once | ORAL | 0 refills | Status: AC

## 2023-07-21 NOTE — Progress Notes (Signed)
Pt's name and DOB verified at the beginning of the pre-visit wit 2 identifiers  Pt denies any difficulty with ambulating,sitting, laying down or rolling side to side  Pt has no issues with ambulation   Pt has no issues moving head neck or swallowing  No egg or soy allergy known to patient   Patient denies ever being intubated  No FH of Malignant Hyperthermia  Pt is not on diet pills or shots  Pt is not on home 02   Pt is not on blood thinners   Pt denies issues with constipation   Pt is not on dialysis  Pt denise any abnormal heart rhythms   Pt denies any upcoming cardiac testing  Pt encouraged to use to use Singlecare or Goodrx to reduce cost   Patient's chart reviewed by Cathlyn Parsons CNRA prior to pre-visit and patient appropriate for the [facility].  Pre-visit completed and red dot placed by patient's name on their procedure day (on provider's schedule).  .  Visit by phone  Pt states weight is 120 lb  Instructed pt why it is important to and  to call if they have any changes in health or new medications. Directed them to the # given and on instructions.     Instructions reviewed. Pt given both [facility] main # and MD on call # prior to instructions.  Pt states understanding. Instructed to review again prior to procedure. Pt states they will.   Instructions sent by mail with coupon and by My Chart   Coupon sent via text to mobile phone and pt verified they received it

## 2023-07-22 ENCOUNTER — Other Ambulatory Visit (HOSPITAL_COMMUNITY)
Admission: RE | Admit: 2023-07-22 | Discharge: 2023-07-22 | Disposition: A | Discharge status: Home / Self Care | Payer: Self-pay | Source: Ambulatory Visit | Attending: Medical Genetics | Admitting: Medical Genetics

## 2023-07-22 DIAGNOSIS — Z006 Encounter for examination for normal comparison and control in clinical research program: Secondary | ICD-10-CM | POA: Insufficient documentation

## 2023-08-01 LAB — GENECONNECT MOLECULAR SCREEN: Genetic Analysis Overall Interpretation: NEGATIVE

## 2023-08-11 ENCOUNTER — Encounter: Payer: Self-pay | Admitting: Internal Medicine

## 2023-08-16 ENCOUNTER — Encounter: Payer: Self-pay | Admitting: Internal Medicine

## 2023-08-16 ENCOUNTER — Ambulatory Visit (AMBULATORY_SURGERY_CENTER): Payer: Medicare Other | Admitting: Internal Medicine

## 2023-08-16 VITALS — BP 112/65 | HR 61 | Resp 15

## 2023-08-16 DIAGNOSIS — K648 Other hemorrhoids: Secondary | ICD-10-CM

## 2023-08-16 DIAGNOSIS — Z860100 Personal history of colon polyps, unspecified: Secondary | ICD-10-CM

## 2023-08-16 DIAGNOSIS — K5730 Diverticulosis of large intestine without perforation or abscess without bleeding: Secondary | ICD-10-CM | POA: Diagnosis not present

## 2023-08-16 DIAGNOSIS — Z860101 Personal history of adenomatous and serrated colon polyps: Secondary | ICD-10-CM | POA: Diagnosis not present

## 2023-08-16 DIAGNOSIS — Z1211 Encounter for screening for malignant neoplasm of colon: Secondary | ICD-10-CM

## 2023-08-16 MED ORDER — SODIUM CHLORIDE 0.9 % IV SOLN: 500.0000 mL | INTRAVENOUS | Status: DC

## 2023-08-16 NOTE — Patient Instructions (Signed)
-  Handout on diverticulosis and hemorrhoids provided -repeat colonoscopy in5 years for surveillance recommended.  -Continue present medications   YOU HAD AN ENDOSCOPIC PROCEDURE TODAY AT THE [facility]:   Refer to the procedure report that was given to you for any specific questions about what was found during the examination.  If the procedure report does not answer your questions, please call your gastroenterologist to clarify.  If you requested that your care partner not be given the details of your procedure findings, then the procedure report has been included in a sealed envelope for you to review at your convenience later.  YOU SHOULD EXPECT: Some feelings of bloating in the abdomen. Passage of more gas than usual.  Walking can help get rid of the air that was put into your GI tract during the procedure and reduce the bloating. If you had a lower endoscopy (such as a colonoscopy or flexible sigmoidoscopy) you may notice spotting of blood in your stool or on the toilet paper. If you underwent a bowel prep for your procedure, you may not have a normal bowel movement for a few days.  Please Note:  You might notice some irritation and congestion in your nose or some drainage.  This is from the oxygen used during your procedure.  There is no need for concern and it should clear up in a day or so.  SYMPTOMS TO REPORT IMMEDIATELY:  Following lower endoscopy (colonoscopy or flexible sigmoidoscopy):  Excessive amounts of blood in the stool  Significant tenderness or worsening of abdominal pains  Swelling of the abdomen that is new, acute  Fever of 100F or higher   For urgent or emergent issues, a gastroenterologist can be reached at any hour by calling [phone]. Do not use MyChart messaging for urgent concerns.    DIET:  We do recommend a small meal at first, but then you may proceed to your regular diet.  Drink plenty of fluids but you should avoid alcoholic beverages for 24  hours.  ACTIVITY:  You should plan to take it easy for the rest of today and you should NOT DRIVE or use heavy machinery until tomorrow (because of the sedation medicines used during the test).    FOLLOW UP: Our staff will call the number listed on your records the next business day following your procedure.  We will call around 7:15- 8:00 am to check on you and address any questions or concerns that you may have regarding the information given to you following your procedure. If we do not reach you, we will leave a message.     If any biopsies were taken you will be contacted by phone or by letter within the next 1-3 weeks.  Please call us at [phone] if you have not heard about the biopsies in 3 weeks.    SIGNATURES/CONFIDENTIALITY: You and/or your care partner have signed paperwork which will be entered into your electronic medical record.  These signatures attest to the fact that that the information above on your After Visit Summary has been reviewed and is understood.  Full responsibility of the confidentiality of this discharge information lies with you and/or your care-partner.

## 2023-08-16 NOTE — Progress Notes (Signed)
 Report to PACU, RN, vss, BBS= Clear.

## 2023-08-16 NOTE — Progress Notes (Signed)
 Pt's states no medical or surgical changes since previsit or office visit.

## 2023-08-16 NOTE — Op Note (Signed)
 [facility] Patient Name: Cassandra May Procedure Date: 08/16/2023 8:46 AM MRN: [id number] Endoscopist: Norleen SAILOR. Abran , MD, [id number] Age: 68 Referring MD:  Date of Birth: [birth year] Gender: Female Account #: [id number] Procedure:                Colonoscopy Indications:              High risk colon cancer surveillance: Personal                            history of adenoma (10 mm or greater in size), High                            risk colon cancer surveillance: Personal history of                            adenoma with villous component, High risk colon                            cancer surveillance: Personal history of multiple                            (3 or more) adenomas, High risk colon cancer                            surveillance: Personal history of sessile serrated                            colon polyp (less than 10 mm in size) with no                            dysplasia. Previous examinations elsewhere 2012,                            2015 and here (Dr. Eda) 2021 Medicines:                Monitored Anesthesia Care Procedure:                Pre-Anesthesia Assessment:                           - Prior to the procedure, a History and Physical                            was performed, and patient medications and                            allergies were reviewed. The patient's tolerance of                            previous anesthesia was also reviewed. The risks                            and benefits of the procedure and the sedation  options and risks were discussed with the patient.                            All questions were answered, and informed consent                            was obtained. Prior Anticoagulants: The patient has                            taken no anticoagulant or antiplatelet agents. ASA                            Grade Assessment: II - A patient with mild systemic                             disease. After reviewing the risks and benefits,                            the patient was deemed in satisfactory condition to                            undergo the procedure.                           After obtaining informed consent, the colonoscope                            was passed under direct vision. Throughout the                            procedure, the patient's blood pressure, pulse, and                            oxygen saturations were monitored continuously. The                            Olympus Scope SN: [id number] was introduced through                            the anus and advanced to the the cecum, identified                            by appendiceal orifice and ileocecal valve. The                            ileocecal valve, appendiceal orifice, and rectum                            were photographed. The quality of the bowel                            preparation was good. The colonoscopy was performed  without difficulty. The patient tolerated the                            procedure well. The bowel preparation used was                            SUPREP via split dose instruction. Scope In: 8:57:15 AM Scope Out: 9:08:47 AM Scope Withdrawal Time: 0 hours 8 minutes 20 seconds  Total Procedure Duration: 0 hours 11 minutes 32 seconds  Findings:                 Multiple diverticula were found in the left colon.                           Internal hemorrhoids were found during                            retroflexion. The hemorrhoids were small.                           The exam was otherwise without abnormality on                            direct and retroflexion views. Complications:            No immediate complications. Estimated blood loss:                            None. Estimated Blood Loss:     Estimated blood loss: none. Impression:               - Diverticulosis in the left colon.                           - Internal hemorrhoids.                            - The examination was otherwise normal on direct                            and retroflexion views.                           - No specimens collected. Recommendation:           - Repeat colonoscopy in 5 years for surveillance.                           - Patient has a contact number available for                            emergencies. The signs and symptoms of potential                            delayed complications were discussed with the  patient. Return to normal activities tomorrow.                            Written discharge instructions were provided to the                            patient.                           - Resume previous diet.                           - Continue present medications. Norleen SAILOR. Abran, MD 08/16/2023 9:13:10 AM This report has been signed electronically.

## 2023-08-16 NOTE — Progress Notes (Signed)
 HISTORY OF PRESENT ILLNESS:  Cassandra May is a 68 y.o. female with a history of multiple advanced adenomatous as well as sessile serrated colon polyps.  Now for surveillance colonoscopy.  Last examination 2021  REVIEW OF SYSTEMS:  All non-GI ROS negative except for  Past Medical History:  Diagnosis Date   Aortic atherosclerosis (HCC) 07/08/2023   Colon polyps    Mixed hyperlipidemia     Past Surgical History:  Procedure Laterality Date   COLONOSCOPY     POLYPECTOMY     vaginal deliveries      Social History Emelyn Roen  reports that she has never smoked. She has never used smokeless tobacco. She reports current alcohol use. She reports that she does not use drugs.  family history includes Arthritis in her brother; Breast cancer (age of onset: 39) in her maternal grandmother; Diabetes in her brother; Heart attack (age of onset: 32) in her father; Heart attack (age of onset: 21) in her brother; Kidney disease in her son and son; Non-Hodgkin's lymphoma in her mother.  No Known Allergies     PHYSICAL EXAMINATION: Vital signs: There were no vitals taken for this visit. General: Well-developed, well-nourished, no acute distress HEENT: Sclerae are anicteric, conjunctiva pink. Oral mucosa intact Lungs: Clear Heart: Regular Abdomen: soft, nontender, nondistended, no obvious ascites, no peritoneal signs, normal bowel sounds. No organomegaly. Extremities: No edema Psychiatric: alert and oriented x3. Cooperative     ASSESSMENT:  History of multiple, advanced adenomatous polyps.  Sessile serrated polyps.   PLAN:  Surveillance colonoscopy

## 2023-08-17 ENCOUNTER — Telehealth: Payer: Self-pay

## 2023-08-17 NOTE — Telephone Encounter (Signed)
 LMOM

## 2023-08-28 ENCOUNTER — Other Ambulatory Visit: Payer: Self-pay | Admitting: Family Medicine

## 2023-08-28 DIAGNOSIS — E782 Mixed hyperlipidemia: Secondary | ICD-10-CM

## 2023-09-21 ENCOUNTER — Ambulatory Visit (INDEPENDENT_AMBULATORY_CARE_PROVIDER_SITE_OTHER): Payer: Medicare Other

## 2023-09-21 ENCOUNTER — Telehealth: Payer: Self-pay

## 2023-09-21 VITALS — Ht 63.0 in | Wt 120.0 lb

## 2023-09-21 DIAGNOSIS — Z0000 Encounter for general adult medical examination without abnormal findings: Secondary | ICD-10-CM

## 2023-09-21 NOTE — Patient Instructions (Addendum)
 Cassandra May , Thank you for taking time to come for your Medicare Wellness Visit. I appreciate your ongoing commitment to your health goals. Please review the following plan we discussed and let me know if I can assist you in the future.   Referrals/Orders/Follow-Ups/Clinician Recommendations: Aim for 30 minutes of exercise or brisk walking, 6-8 glasses of water, and 5 servings of fruits and vegetables each day. Obtain a copy of vaccines received from pharmacy for PCP.  This is a list of the screening recommended for you and due dates:  Health Maintenance  Topic Date Due   Zoster (Shingles) Vaccine (1 of 2) 01/01/1973   COVID-19 Vaccine (6 - 2024-25 season) 04/03/2023   Medicare Annual Wellness Visit  09/20/2024   Mammogram  11/07/2024   DTaP/Tdap/Td vaccine (2 - Td or Tdap) 08/10/2026   Colon Cancer Screening  08/15/2028   Pneumonia Vaccine  Completed   Flu Shot  Completed   DEXA scan (bone density measurement)  Completed   Hepatitis C Screening  Completed   HPV Vaccine  Aged Out    Advanced directives: (In Chart) A copy of your advanced directives are scanned into your chart should your provider ever need it.  Next Medicare Annual Wellness Visit scheduled for next year: Yes - 06/2025 by Video

## 2023-09-21 NOTE — Progress Notes (Cosign Needed Addendum)
 Subjective:  Please attest and cosign this visit due to patients primary care provider not being in the office at the time the visit was completed.     Cassandra May is a 68 y.o. female who presents for Medicare Annual (Subsequent) preventive examination.  Visit Complete: Virtual I connected with  Cassandra May on 09/21/23 by a video and audio enabled telemedicine application and verified that I am speaking with the correct person using two identifiers.  Patient Location: Home  Provider Location: Office/Clinic  I discussed the limitations of evaluation and management by telemedicine. The patient expressed understanding and agreed to proceed.  Vital Signs: Because this visit was a virtual/telehealth visit, some criteria may be missing or patient reported. Any vitals not documented were not able to be obtained and vitals that have been documented are patient reported.  Patient Medicare AWV questionnaire was completed by the patient on 09/19/2024; I have confirmed that all information answered by patient is correct and no changes since this date.  Cardiac Risk Factors include: advanced age (>52men, >38 women);dyslipidemia     Objective:    Today's Vitals   09/21/23 0800  Weight: 120 lb (54.4 kg)  Height: 5\' 3"  (1.6 m)   Body mass index is 21.26 kg/m.     09/21/2023    7:58 AM 04/30/2022    8:41 AM 04/27/2021    9:31 AM  Advanced Directives  Does Patient Have a Medical Advance Directive? Yes Yes Yes  Type of Estate agent of [place];Living will Living will Living will  Does patient want to make changes to medical advance directive? No - Patient declined No - Patient declined No - Patient declined  Copy of Healthcare Power of Attorney in Chart? Yes - validated most recent copy scanned in chart (See row information)      Current Medications (verified) Outpatient Encounter Medications as of 09/21/2023  Medication Sig   FLUZONE HIGH-DOSE 0.5 ML  injection Inject 0.5 mLs into the muscle once.   Multiple Vitamins-Iron (MULTIPLE VITAMIN/IRON PO) Take by mouth.   PREVNAR 20 0.5 ML injection Inject 0.5 mLs into the muscle once.   rosuvastatin (CRESTOR) 20 MG tablet Take 1 tablet (20 mg total) by mouth daily.   No facility-administered encounter medications on file as of 09/21/2023.    Allergies (verified) Patient has no known allergies.   History: Past Medical History:  Diagnosis Date   Aortic atherosclerosis (HCC) 07/08/2023   Colon polyps    Mixed hyperlipidemia    Past Surgical History:  Procedure Laterality Date   COLONOSCOPY     POLYPECTOMY     vaginal deliveries     Family History  Problem Relation Age of Onset   Non-Hodgkin's lymphoma Mother        died at 38   Heart attack Father 48   Breast cancer Maternal Grandmother 54   Heart attack Brother 28   Diabetes Brother    Arthritis Brother    Kidney disease Son        PKD   Kidney disease Son        PKD   Colon cancer Neg Hx    Colon polyps Neg Hx    Esophageal cancer Neg Hx    Rectal cancer Neg Hx    Stomach cancer Neg Hx    Social History   Socioeconomic History   Marital status: Married    Spouse name: Not on file   Number of children: Not on file   Years  of education: Not on file   Highest education level: Bachelor's degree (e.g., BA, AB, BS)  Occupational History   Not on file  Tobacco Use   Smoking status: Never   Smokeless tobacco: Never  Vaping Use   Vaping status: Never Used  Substance and Sexual Activity   Alcohol use: Yes    Alcohol/week: 2.0 standard drinks of alcohol    Types: 2 Glasses of wine per week    Comment: occassionally   Drug use: Never   Sexual activity: Not on file  Other Topics Concern   Not on file  Social History Narrative   Not on file   Social Drivers of Health   Financial Resource Strain: Low Risk  (09/21/2023)   Overall Financial Resource Strain (CARDIA)    Difficulty of Paying Living Expenses: Not hard  at all  Food Insecurity: No Food Insecurity (09/21/2023)   Hunger Vital Sign    Worried About Running Out of Food in the Last Year: Never true    Ran Out of Food in the Last Year: Never true  Transportation Needs: No Transportation Needs (09/21/2023)   PRAPARE - Administrator, Civil Service (Medical): No    Lack of Transportation (Non-Medical): No  Physical Activity: Sufficiently Active (09/21/2023)   Exercise Vital Sign    Days of Exercise per Week: 5 days    Minutes of Exercise per Session: 60 min  Stress: No Stress Concern Present (09/21/2023)   [employer] of Occupational Health - Occupational Stress Questionnaire    Feeling of Stress : Not at all  Social Connections: Socially Integrated (09/21/2023)   Social Connection and Isolation Panel [NHANES]    Frequency of Communication with Friends and Family: More than three times a week    Frequency of Social Gatherings with Friends and Family: More than three times a week    Attends Religious Services: More than 4 times per year    Active Member of [employer] or Organizations: Yes    Attends Engineer, structural: More than 4 times per year    Marital Status: Married    Tobacco Counseling Counseling given: Not Answered   Clinical Intake:  Pre-visit preparation completed: Yes  Pain : No/denies pain     BMI - recorded: 21.26 Nutritional Status: BMI of 19-24  Normal Nutritional Risks: None Diabetes: No  How often do you need to have someone help you when you read instructions, pamphlets, or other written materials from your doctor or pharmacy?: 1 - Never  Interpreter Needed?: No  Information entered by :: Hassell Halim, CMA   Activities of Daily Living    09/21/2023    8:03 AM  In your present state of health, do you have any difficulty performing the following activities:  May? 0  Vision? 0  Difficulty concentrating or making decisions? 0  Walking or climbing stairs? 0  Dressing or bathing? 0   Doing errands, shopping? 0  Preparing Food and eating ? N  Using the Toilet? N  In the past six months, have you accidently leaked urine? N  Do you have problems with loss of bowel control? N  Managing your Medications? N  Managing your Finances? N  Housekeeping or managing your Housekeeping? N    Patient Care Team: Avanell Shackleton, NP-C as PCP - General (Family Medicine)  Indicate any recent Medical Services you may have received from other than [facility] providers in the past year (date may be approximate).  Assessment:   This is a routine wellness examination for Anderson.  May/Vision screen May Screening - Comments:: Denies May difficulties   Vision Screening - Comments:: Wears rx glasses - up to date with routine eye exams with  [facility]   Goals Addressed               This Visit's Progress     Patient Stated (pt-stated)        Patient stated she plans to continue to be healthy.       Depression Screen    09/21/2023    8:09 AM 06/08/2023    9:46 AM 04/30/2022    8:40 AM 04/27/2021    9:30 AM 05/15/2020   10:54 AM 05/15/2019    8:32 AM  PHQ 2/9 Scores  PHQ - 2 Score 0 0 0 0 0 0    Fall Risk    09/21/2023    8:04 AM 06/08/2023    9:46 AM 04/30/2022    8:41 AM 04/26/2022    9:06 AM 04/27/2021    9:30 AM  Fall Risk   Falls in the past year? 0 0 0 0 0  Number falls in past yr: 0 0 0 0 0  Injury with Fall? 0 0 0 0 0  Risk for fall due to : No Fall Risks No Fall Risks No Fall Risks  No Fall Risks  Follow up Falls prevention discussed;Falls evaluation completed Falls evaluation completed Falls evaluation completed  Falls evaluation completed    MEDICARE RISK AT HOME: Medicare Risk at Home Any stairs in or around the home?: Yes If so, are there any without handrails?: No Home free of loose throw rugs in walkways, pet beds, electrical cords, etc?: Yes Adequate lighting in your home to reduce risk of falls?: No Life alert?: No Use of a cane,  walker or w/c?: No Grab bars in the bathroom?: No Shower chair or bench in shower?: No Elevated toilet seat or a handicapped toilet?: No  TIMED UP AND GO:  Was the test performed?  No    Cognitive Function:        09/21/2023    8:05 AM 04/30/2022    8:43 AM  6CIT Screen  What Year? 0 points 0 points  What month? 0 points 0 points  What time? 0 points 0 points  Count back from 20 0 points 0 points  Months in reverse 0 points 0 points  Repeat phrase 0 points 0 points  Total Score 0 points 0 points    Immunizations Immunization History  Administered Date(s) Administered   Fluad Quad(high Dose 65+) 05/15/2019, 05/15/2020   Hepatitis A 11/22/1995, 08/10/2016   Hepatitis B 05/24/1995, 06/28/1995   Influenza-Unspecified 04/18/2012, 08/10/2016, 07/27/2018, 04/15/2021, 05/11/2022, 05/21/2023   PFIZER(Purple Top)SARS-COV-2 Vaccination 08/24/2019, 09/14/2019, 04/28/2020, 11/02/2020   PNEUMOCOCCAL CONJUGATE-20 05/31/2023   Pfizer Covid-19 Vaccine Bivalent Booster 28yrs & up 04/15/2021   Tdap 08/10/2016   Varicella 05/19/2015   Zoster, Live 05/31/2023    TDAP status: Up to date - 08/10/2016   Flu Vaccine status: Up to date - 05/21/2023  Pneumococcal vaccine status: Up to date - 05/31/2023   Covid-19 vaccine status: Information provided on how to obtain vaccines.   Qualifies for Shingles Vaccine? Yes   Zostavax completed No   Shingrix Completed?: No.    Education has been provided regarding the importance of this vaccine. Patient has been advised to call insurance company to determine out of pocket expense if they have not yet  received this vaccine. Advised may also receive vaccine at local pharmacy or Health Dept. Verbalized acceptance and understanding.  Screening Tests Health Maintenance  Topic Date Due   Zoster Vaccines- Shingrix (1 of 2) 01/01/1973   COVID-19 Vaccine (6 - 2024-25 season) 04/03/2023   Medicare Annual Wellness (AWV)  09/20/2024   MAMMOGRAM  11/07/2024    DTaP/Tdap/Td (2 - Td or Tdap) 08/10/2026   Colonoscopy  08/15/2028   Pneumonia Vaccine 8+ Years old  Completed   INFLUENZA VACCINE  Completed   DEXA SCAN  Completed   Hepatitis C Screening  Completed   HPV VACCINES  Aged Out    Health Maintenance  Health Maintenance Due  Topic Date Due   Zoster Vaccines- Shingrix (1 of 2) 01/01/1973   COVID-19 Vaccine (6 - 2024-25 season) 04/03/2023    Colorectal cancer screening: Type of screening: Colonoscopy. Completed 08/16/2023. Repeat every 5 years  Mammogram Status: Completed 11/08/2022 - Due in 2 years  Bone Density status: Completed 04/06/2023. Results reflect: Bone density results: OSTEOPENIA. Repeat every 3-5 years.   Additional Screening:  Hepatitis C Screening: does qualify; Completed 05/15/2019  Vision Screening: Recommended annual ophthalmology exams for early detection of glaucoma and other disorders of the eye. Is the patient up to date with their annual eye exam?  Yes  Who is the provider or what is the name of the office in which the patient attends annual eye exams? [facility] If pt is not established with a provider, would they like to be referred to a provider to establish care? No .   Dental Screening: Recommended annual dental exams for proper oral hygiene  Community Resource Referral / Chronic Care Management: CRR required this visit?  No   CCM required this visit?  No     Plan:     I have personally reviewed and noted the following in the patient's chart:   Medical and social history Use of alcohol, tobacco or illicit drugs  Current medications and supplements including opioid prescriptions. Patient is not currently taking opioid prescriptions. Functional ability and status Nutritional status Physical activity Advanced directives List of other physicians Hospitalizations, surgeries, and ER visits in previous 12 months Vitals Screenings to include cognitive, depression, and falls Referrals and  appointments  In addition, I have reviewed and discussed with patient certain preventive protocols, quality metrics, and best practice recommendations. A written personalized care plan for preventive services as well as general preventive health recommendations were provided to patient.     Darreld Mclean, CMA   09/21/2023   After Visit Summary: (MyChart) Due to this being a telephonic visit, the after visit summary with patients personalized plan was offered to patient via MyChart   Nurse Notes: none   Medical screening examination/treatment/procedure(s) were performed by non-physician practitioner and as supervising physician I was immediately available for consultation/collaboration.  I agree with above. Jacinta Shoe, MD

## 2023-09-21 NOTE — Telephone Encounter (Signed)
-----   Message from [facility] Catrina R sent at 09/21/2023  8:37 AM EST ----- Regarding: Lab order request Patient is asking for a return phonecall by Provider/Clinical staff regarding her next cholesterol lab appt due to changing med.

## 2023-09-23 ENCOUNTER — Ambulatory Visit (INDEPENDENT_AMBULATORY_CARE_PROVIDER_SITE_OTHER): Payer: Medicare Other | Admitting: Family Medicine

## 2023-09-23 ENCOUNTER — Encounter: Payer: Self-pay | Admitting: Family Medicine

## 2023-09-23 VITALS — BP 124/84 | HR 65 | Temp 97.8°F | Ht 63.0 in | Wt 124.0 lb

## 2023-09-23 DIAGNOSIS — I700 Atherosclerosis of aorta: Secondary | ICD-10-CM

## 2023-09-23 DIAGNOSIS — Z8249 Family history of ischemic heart disease and other diseases of the circulatory system: Secondary | ICD-10-CM | POA: Diagnosis not present

## 2023-09-23 DIAGNOSIS — E785 Hyperlipidemia, unspecified: Secondary | ICD-10-CM

## 2023-09-23 DIAGNOSIS — R931 Abnormal findings on diagnostic imaging of heart and coronary circulation: Secondary | ICD-10-CM

## 2023-09-23 LAB — LIPID PANEL
Cholesterol: 164 mg/dL (ref 0–200)
HDL: 66.5 mg/dL (ref >39.00)
LDL Cholesterol: 79 mg/dL (ref 0–99)
NonHDL: 97.33
Total CHOL/HDL Ratio: 2
Triglycerides: 92 mg/dL (ref 0.0–149.0)
VLDL: 18.4 mg/dL (ref 0.0–40.0)

## 2023-09-23 LAB — ALT: ALT: 14 U/L (ref 0–35)

## 2023-09-23 NOTE — Progress Notes (Signed)
 Subjective:     Patient ID: Cassandra May, female    DOB: 06/29/1956, 68 y.o.   MRN: [id number]  Chief Complaint  Patient presents with   Hyperlipidemia    FASTING- recheck lipids after starting on crestor    Hyperlipidemia Pertinent negatives include no chest pain, focal weakness, myalgias or shortness of breath.     History of Present Illness         She is here for follow-up and fasting lipids due to changing cholesterol medication.  LDL 92 in November 2024.  She is now on rosuvastatin and her LDL goal less than 70 since elevated CT coronary calcium score of 40.  She is exercising regularly and denies having any syncope, chest pain or palpitations.  She is eating mostly a plant-based diet.  Father had MI and died at age 92. Brother has severe heart disease.     There are no preventive care reminders to display for this patient.  Past Medical History:  Diagnosis Date   Aortic atherosclerosis (HCC) 07/08/2023   Colon polyps    Mixed hyperlipidemia     Past Surgical History:  Procedure Laterality Date   COLONOSCOPY     POLYPECTOMY     vaginal deliveries      Family History  Problem Relation Age of Onset   Non-Hodgkin's lymphoma Mother        died at 35   Heart attack Father 23   Breast cancer Maternal Grandmother 54   Heart attack Brother 35   Diabetes Brother    Arthritis Brother    Kidney disease Son        PKD   Kidney disease Son        PKD   Colon cancer Neg Hx    Colon polyps Neg Hx    Esophageal cancer Neg Hx    Rectal cancer Neg Hx    Stomach cancer Neg Hx     Social History   Socioeconomic History   Marital status: Married    Spouse name: Not on file   Number of children: Not on file   Years of education: Not on file   Highest education level: Bachelor's degree (e.g., BA, AB, BS)  Occupational History   Not on file  Tobacco Use   Smoking status: Never   Smokeless tobacco: Never  Vaping Use   Vaping status: Never Used   Substance and Sexual Activity   Alcohol use: Yes    Alcohol/week: 2.0 standard drinks of alcohol    Types: 2 Glasses of wine per week    Comment: occassionally   Drug use: Never   Sexual activity: Not on file  Other Topics Concern   Not on file  Social History Narrative   Not on file   Social Drivers of Health   Financial Resource Strain: Low Risk  (09/21/2023)   Overall Financial Resource Strain (CARDIA)    Difficulty of Paying Living Expenses: Not hard at all  Food Insecurity: No Food Insecurity (09/21/2023)   Hunger Vital Sign    Worried About Running Out of Food in the Last Year: Never true    Ran Out of Food in the Last Year: Never true  Transportation Needs: No Transportation Needs (09/21/2023)   PRAPARE - Administrator, Civil Service (Medical): No    Lack of Transportation (Non-Medical): No  Physical Activity: Sufficiently Active (09/21/2023)   Exercise Vital Sign    Days of Exercise per Week: 5 days  Minutes of Exercise per Session: 60 min  Stress: No Stress Concern Present (09/21/2023)   [employer] of Occupational Health - Occupational Stress Questionnaire    Feeling of Stress : Not at all  Social Connections: Socially Integrated (09/21/2023)   Social Connection and Isolation Panel [NHANES]    Frequency of Communication with Friends and Family: More than three times a week    Frequency of Social Gatherings with Friends and Family: More than three times a week    Attends Religious Services: More than 4 times per year    Active Member of [employer] or Organizations: Yes    Attends Engineer, structural: More than 4 times per year    Marital Status: Married  Catering manager Violence: Not At Risk (09/21/2023)   Humiliation, Afraid, Rape, and Kick questionnaire    Fear of Current or Ex-Partner: No    Emotionally Abused: No    Physically Abused: No    Sexually Abused: No    Outpatient Medications Prior to Visit  Medication Sig Dispense Refill    FLUZONE HIGH-DOSE 0.5 ML injection Inject 0.5 mLs into the muscle once.     Multiple Vitamins-Iron (MULTIPLE VITAMIN/IRON PO) Take by mouth.     PREVNAR 20 0.5 ML injection Inject 0.5 mLs into the muscle once.     rosuvastatin (CRESTOR) 20 MG tablet Take 1 tablet (20 mg total) by mouth daily. 90 tablet 1   No facility-administered medications prior to visit.    No Known Allergies  Review of Systems  Constitutional:  Negative for chills and fever.  Respiratory:  Negative for shortness of breath.   Cardiovascular:  Negative for chest pain, palpitations and leg swelling.  Gastrointestinal:  Negative for diarrhea, nausea and vomiting.  Musculoskeletal:  Negative for joint pain and myalgias.  Neurological:  Negative for dizziness and focal weakness.       Objective:    Physical Exam Constitutional:      General: She is not in acute distress.    Appearance: She is not ill-appearing.  Eyes:     Extraocular Movements: Extraocular movements intact.     Conjunctiva/sclera: Conjunctivae normal.  Cardiovascular:     Rate and Rhythm: Normal rate.  Pulmonary:     Effort: Pulmonary effort is normal.  Musculoskeletal:     Cervical back: Normal range of motion and neck supple.  Skin:    General: Skin is warm and dry.  Neurological:     General: No focal deficit present.     Mental Status: She is alert and oriented to person, place, and time.  Psychiatric:        Mood and Affect: Mood normal.        Behavior: Behavior normal.        Thought Content: Thought content normal.      BP 124/84 (BP Location: Left Arm, Patient Position: Sitting)   Pulse 65   Temp 97.8 F (36.6 C) (Temporal)   Ht 5\' 3"  (1.6 m)   Wt 124 lb (56.2 kg)   SpO2 99%   BMI 21.97 kg/m  Wt Readings from Last 3 Encounters:  09/23/23 124 lb (56.2 kg)  09/21/23 120 lb (54.4 kg)  07/21/23 120 lb (54.4 kg)       Assessment & Plan:   Problem List Items Addressed This Visit     Aortic atherosclerosis (HCC)    Elevated coronary artery calcium score   Relevant Orders   Lipid panel   Ambulatory referral to Cardiology  Family history of heart disease in female family member before age 65   Relevant Orders   Ambulatory referral to Cardiology   Hyperlipidemia LDL goal <70 - Primary   Relevant Orders   Lipid panel   Ambulatory referral to Cardiology   ALT   Reviewed labs and CT coronary calcium results.  CT coronary score 40 and atherosclerosis seen.  Switched statin from atorvastatin to rosuvastatin in December 2024 and LDL goal now <70.   Check fasting lipids today.  She has a family history of heart disease in father and brother.  She and I agree a referral to cardiology for further evaluation is warranted.  Referral made.  She is asymptomatic.  She will continue exercise and a healthy, low-fat, plant-based diet. Follow-up here in 6 months or sooner if needed.    I am having Cassandra May maintain her Multiple Vitamins-Iron (MULTIPLE VITAMIN/IRON PO), rosuvastatin, Fluzone High-Dose, and Prevnar 20.  No orders of the defined types were placed in this encounter.

## 2023-10-14 ENCOUNTER — Other Ambulatory Visit: Payer: Self-pay | Admitting: Family Medicine

## 2023-10-14 DIAGNOSIS — Z0000 Encounter for general adult medical examination without abnormal findings: Secondary | ICD-10-CM

## 2023-11-08 ENCOUNTER — Ambulatory Visit
Admission: RE | Admit: 2023-11-08 | Discharge: 2023-11-08 | Disposition: A | Discharge status: Home / Self Care | Source: Ambulatory Visit | Attending: Family Medicine | Admitting: Family Medicine

## 2023-11-08 DIAGNOSIS — Z0000 Encounter for general adult medical examination without abnormal findings: Secondary | ICD-10-CM

## 2023-11-08 DIAGNOSIS — Z1231 Encounter for screening mammogram for malignant neoplasm of breast: Secondary | ICD-10-CM | POA: Diagnosis not present

## 2023-12-12 ENCOUNTER — Ambulatory Visit: Admitting: Cardiovascular Disease

## 2023-12-29 ENCOUNTER — Other Ambulatory Visit: Payer: Self-pay | Admitting: Family Medicine

## 2023-12-29 DIAGNOSIS — E785 Hyperlipidemia, unspecified: Secondary | ICD-10-CM

## 2023-12-29 DIAGNOSIS — I700 Atherosclerosis of aorta: Secondary | ICD-10-CM

## 2023-12-29 DIAGNOSIS — R931 Abnormal findings on diagnostic imaging of heart and coronary circulation: Secondary | ICD-10-CM

## 2024-01-13 DIAGNOSIS — L821 Other seborrheic keratosis: Secondary | ICD-10-CM | POA: Diagnosis not present

## 2024-01-13 DIAGNOSIS — L448 Other specified papulosquamous disorders: Secondary | ICD-10-CM | POA: Diagnosis not present

## 2024-01-13 DIAGNOSIS — L538 Other specified erythematous conditions: Secondary | ICD-10-CM | POA: Diagnosis not present

## 2024-01-13 DIAGNOSIS — L814 Other melanin hyperpigmentation: Secondary | ICD-10-CM | POA: Diagnosis not present

## 2024-01-13 DIAGNOSIS — D225 Melanocytic nevi of trunk: Secondary | ICD-10-CM | POA: Diagnosis not present

## 2024-03-14 NOTE — Progress Notes (Signed)
 Cardiology Office Note:  .   Date:  03/15/2024  ID:  Cassandra May, DOB 1955/08/23, MRN [id number] PCP: Lendia Boby CROME, NP-C  [facility] Providers Cardiologist:  None {  History of Present Illness: .    Chief Complaint  Patient presents with   Chest Pain    Cassandra May is a 68 y.o. female with history of coronary calcium  who presents for the evaluation of coronary calcium  at the request of Lendia, Vickie L, NP-C.   History of Present Illness   Cassandra May is a 68 year old female who presents for evaluation of an elevated coronary calcium  score and chest pain.   She underwent a coronary calcium  scoring on January 12, 2023, which showed a calcium  score of 40, placing her in the 63rd percentile. Her most recent LDL cholesterol level is 79. She has been on Lipitor for approximately ten years, initially at 10 mg, then increased to 20 mg. Six months ago, her medication was switched to Crestor , and she is currently on 20 mg of Crestor .  She experiences occasional chest pain, described as a light pressure or tightness that increases and then decreases, resolving within two to four minutes. These episodes occur infrequently, about once a month, and have no clear triggers, sometimes occurring during sleep or while sitting. She has never experienced chest pain during exercise, which includes biking, swimming, walking, and skiing.  Her family history is significant for heart disease; her father died of a sudden heart attack at age 19, and her brother had a heart attack in his early 12s, later developing a brain bleed while on warfarin.  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 She has been married for 49 years, has four children, and eight grandchildren. She maintains a diet high in vegetables but admits to consuming too much sugar and carbohydrates. She does not take aspirin and has not had a CT scan before,  although she underwent a stress test approximately two years ago.          Problem List Coronary calcium   -CAC 40 (63rd percentile) 2. HLD -T chol 164, TG 92, LDL 79, HDL 67    ROS: All other ROS reviewed and negative. Pertinent positives noted in the HPI.     Studies Reviewed: SABRA   EKG Interpretation Date/Time:  Thursday March 15 2024 08:08:30 EDT Ventricular Rate:  64 PR Interval:  162 QRS Duration:  76 QT Interval:  412 QTC Calculation: 425 R Axis:   77  Text Interpretation: Normal sinus rhythm Normal ECG Confirmed by Barbaraann Kotyk [id number]) on 03/15/2024 8:13:14 AM   Physical Exam:   VS:  BP 118/78   Pulse 64   Ht 5' 3 (1.6 m)   Wt 128 lb 6.4 oz (58.2 kg)   SpO2 98%   BMI 22.75 kg/m    Wt Readings from Last 3 Encounters:  03/15/24 128 lb 6.4 oz (58.2 kg)  09/23/23 124 lb (56.2 kg)  09/21/23 120 lb (54.4 kg)    GEN: Well nourished, well developed in no acute distress NECK: No JVD; No carotid bruits CARDIAC: RRR, no murmurs, rubs, gallops RESPIRATORY:  Clear to auscultation without rales, wheezing or rhonchi  ABDOMEN: Soft, non-tender, non-distended EXTREMITIES:  No edema; No deformity  ASSESSMENT AND PLAN: .   Assessment and Plan    Intermittent chest pain Intermittent chest pain occurring monthly, sometimes nocturnal. Light pressure or tightness resolving in minutes. Normal EKG,  no exercise-induced symptoms. Further evaluation warranted due to age and family history of heart disease. - Order coronary CT angiography (CTA) with contrast. - Perform kidney function tests today for contrast safety. - Schedule CT scan post insurance approval.  Coronary calcium   Aortic atherosclerosis with calcium  score of 40, 63rd percentile for age. Expected level, not accelerated. Statin therapy appropriate as LDL near goal. - Continue statin therapy at current dose. LDL is at goal based on CAC (<100 is fine). Further recommendations pending CCTA with full plaque assessment.  -  No aspirin therapy needed.  Hyperlipidemia on statin therapy Hyperlipidemia managed with Crestor  20 mg. Switched from Lipitor six months ago. Current LDL 79, at goal.  - Continue Crestor  20 mg daily. - LDL <100 is goal.              Follow-up: Return if symptoms worsen or fail to improve.   Signed, Darryle DASEN. Barbaraann, MD, [facility]  [facility]  [address] [place], [place] [address] [phone]  8:30 AM

## 2024-03-15 ENCOUNTER — Ambulatory Visit: Attending: Cardiovascular Disease | Admitting: Cardiovascular Disease

## 2024-03-15 ENCOUNTER — Encounter: Payer: Self-pay | Admitting: Cardiovascular Disease

## 2024-03-15 VITALS — BP 118/78 | HR 64 | Ht 63.0 in | Wt 128.4 lb

## 2024-03-15 DIAGNOSIS — Z01812 Encounter for preprocedural laboratory examination: Secondary | ICD-10-CM | POA: Diagnosis not present

## 2024-03-15 DIAGNOSIS — E782 Mixed hyperlipidemia: Secondary | ICD-10-CM | POA: Diagnosis not present

## 2024-03-15 DIAGNOSIS — R931 Abnormal findings on diagnostic imaging of heart and coronary circulation: Secondary | ICD-10-CM

## 2024-03-15 DIAGNOSIS — R079 Chest pain, unspecified: Secondary | ICD-10-CM

## 2024-03-15 NOTE — Patient Instructions (Signed)
 Medication Instructions:  The current medical regimen is effective;  continue present plan and medications.  *If you need a refill on your cardiac medications before your next appointment, please call your pharmacy*  Lab Work: Please have blood work today (BMP)  If you have labs (blood work) drawn today and your tests are completely normal, you will receive your results only by: MyChart Message (if you have MyChart) OR A paper copy in the mail If you have any lab test that is abnormal or we need to change your treatment, we will call you to review the results.  Testing/Procedures:   Your cardiac CT will be scheduled at one of the below locations:   [facility] [address] [place], [place] [address] [phone]  OR   [facility] [address]  [place], [place] [address]  If scheduled at [facility], please arrive at the [facility] and Children's Entrance (Entrance C2) of [facility] 30 minutes prior to test start time. You can use the FREE valet parking offered at entrance C (encouraged to control the heart rate for the test)  Proceed to the [facility] Radiology Department (first floor) to check-in and test prep.  All radiology patients and guests should use entrance C2 at [facility], accessed from [facility], even though the hospital's physical address listed is [address]  If scheduled at the Heart and Vascular Tower at [address], please enter the parking lot using the [address] entrance and use the FREE valet service at the patient drop-off area. Enter the building and check-in with registration on the main floor.  Please follow these instructions carefully (unless otherwise directed):  An IV will be required for this test and Nitroglycerin  will be given.  Hold all erectile dysfunction medications at least 3 days (72 hrs) prior to test. (Ie viagra, cialis, sildenafil,  tadalafil, etc)   On the Night Before the Test: Be sure to Drink plenty of water. Do not consume any caffeinated/decaffeinated beverages or chocolate 12 hours prior to your test. Do not take any antihistamines 12 hours prior to your test.  On the Day of the Test: Drink plenty of water until 1 hour prior to the test. Do not eat any food 1 hour prior to test. You may take your regular medications prior to the test.  If you take Furosemide/Hydrochlorothiazide/Spironolactone/Chlorthalidone, please HOLD on the morning of the test. Patients who wear a continuous glucose monitor MUST remove the device prior to scanning. FEMALES- please wear underwire-free bra if available, avoid dresses & tight clothing      After the Test: Drink plenty of water. After receiving IV contrast, you may experience a mild flushed feeling. This is normal. On occasion, you may experience a mild rash up to 24 hours after the test. This is not dangerous. If this occurs, you can take Benadryl 25 mg, Zyrtec, Claritin, or Allegra and increase your fluid intake. (Patients taking Tikosyn should avoid Benadryl, and may take Zyrtec, Claritin, or Allegra) If you experience trouble breathing, this can be serious. If it is severe call 911 IMMEDIATELY. If it is mild, please call our office.  We will call to schedule your test 2-4 weeks out understanding that some insurance companies will need an authorization prior to the service being performed.   For more information and frequently asked questions, please visit our website : [email or web address]  For non-scheduling related questions, please contact the cardiac imaging  nurse navigator should you have any questions/concerns: Cardiac Imaging Nurse Navigators Direct Office Dial: [phone]   For scheduling needs, including cancellations and rescheduling, please call [place], [phone].  Follow-Up: At [facility], you and your health needs are our  priority.  As part of our continuing mission to provide you with exceptional heart care, our providers are all part of one team.  This team includes your primary Cardiologist (physician) and Advanced Practice Providers or APPs (Physician Assistants and Nurse Practitioners) who all work together to provide you with the care you need, when you need it.  Your next appointment:   Follow up will be based on the results of the above testing.  We recommend signing up for the patient portal called MyChart.  Sign up information is provided on this After Visit Summary.  MyChart is used to connect with patients for Virtual Visits (Telemedicine).  Patients are able to view lab/test results, encounter notes, upcoming appointments, etc.  Non-urgent messages can be sent to your provider as well.   To learn more about what you can do with MyChart, go to [email or web address].

## 2024-03-16 LAB — BASIC METABOLIC PANEL WITH GFR
BUN/Creatinine Ratio: 22 (ref 12–28)
BUN: 16 mg/dL (ref 8–27)
CO2: 20 mmol/L (ref 20–29)
Calcium: 9.6 mg/dL (ref 8.7–10.3)
Chloride: 105 mmol/L (ref 96–106)
Creatinine, Ser: 0.74 mg/dL (ref 0.57–1.00)
Glucose: 89 mg/dL (ref 70–99)
Potassium: 4.3 mmol/L (ref 3.5–5.2)
Sodium: 141 mmol/L (ref 134–144)
eGFR: 87 mL/min/1.73 (ref >59)

## 2024-03-17 ENCOUNTER — Ambulatory Visit: Payer: Self-pay | Admitting: Cardiovascular Disease

## 2024-03-20 ENCOUNTER — Encounter (HOSPITAL_COMMUNITY): Payer: Self-pay

## 2024-03-22 ENCOUNTER — Ambulatory Visit: Payer: Medicare Other | Admitting: Family Medicine

## 2024-03-23 ENCOUNTER — Ambulatory Visit (HOSPITAL_COMMUNITY)
Admission: RE | Admit: 2024-03-23 | Discharge: 2024-03-23 | Disposition: A | Discharge status: Home / Self Care | Source: Ambulatory Visit | Attending: Cardiovascular Disease | Admitting: Cardiovascular Disease

## 2024-03-23 DIAGNOSIS — R079 Chest pain, unspecified: Secondary | ICD-10-CM | POA: Diagnosis not present

## 2024-03-23 DIAGNOSIS — I2510 Atherosclerotic heart disease of native coronary artery without angina pectoris: Secondary | ICD-10-CM | POA: Diagnosis not present

## 2024-03-23 DIAGNOSIS — R931 Abnormal findings on diagnostic imaging of heart and coronary circulation: Secondary | ICD-10-CM | POA: Diagnosis not present

## 2024-03-23 MED ORDER — NITROGLYCERIN 0.4 MG SL SUBL
0.8000 mg | SUBLINGUAL_TABLET | Freq: Once | SUBLINGUAL | Status: AC
  Administered 2024-03-23: 0.8 mg via SUBLINGUAL

## 2024-03-23 MED ORDER — IOHEXOL 350 MG/ML SOLN
100.0000 mL | Freq: Once | INTRAVENOUS | Status: AC | PRN
  Administered 2024-03-23: 100 mL via INTRAVENOUS

## 2024-04-13 ENCOUNTER — Ambulatory Visit: Admitting: Family Medicine

## 2024-06-24 ENCOUNTER — Other Ambulatory Visit: Payer: Self-pay | Admitting: Family Medicine

## 2024-06-24 DIAGNOSIS — R931 Abnormal findings on diagnostic imaging of heart and coronary circulation: Secondary | ICD-10-CM

## 2024-06-24 DIAGNOSIS — I700 Atherosclerosis of aorta: Secondary | ICD-10-CM

## 2024-06-24 DIAGNOSIS — E785 Hyperlipidemia, unspecified: Secondary | ICD-10-CM

## 2024-07-27 ENCOUNTER — Other Ambulatory Visit: Payer: Self-pay | Admitting: Family Medicine

## 2024-07-27 DIAGNOSIS — R931 Abnormal findings on diagnostic imaging of heart and coronary circulation: Secondary | ICD-10-CM

## 2024-07-27 DIAGNOSIS — E785 Hyperlipidemia, unspecified: Secondary | ICD-10-CM

## 2024-07-27 DIAGNOSIS — I700 Atherosclerosis of aorta: Secondary | ICD-10-CM

## 2024-08-09 ENCOUNTER — Encounter: Payer: Self-pay | Admitting: Family Medicine

## 2024-08-09 ENCOUNTER — Ambulatory Visit: Admitting: Family Medicine

## 2024-08-09 VITALS — BP 120/70 | HR 64 | Temp 97.6°F | Ht 63.0 in | Wt 127.0 lb

## 2024-08-09 DIAGNOSIS — Z0001 Encounter for general adult medical examination with abnormal findings: Secondary | ICD-10-CM

## 2024-08-09 DIAGNOSIS — E559 Vitamin D deficiency, unspecified: Secondary | ICD-10-CM | POA: Diagnosis not present

## 2024-08-09 DIAGNOSIS — R931 Abnormal findings on diagnostic imaging of heart and coronary circulation: Secondary | ICD-10-CM

## 2024-08-09 DIAGNOSIS — Z0000 Encounter for general adult medical examination without abnormal findings: Secondary | ICD-10-CM

## 2024-08-09 DIAGNOSIS — Z1329 Encounter for screening for other suspected endocrine disorder: Secondary | ICD-10-CM | POA: Diagnosis not present

## 2024-08-09 DIAGNOSIS — E7800 Pure hypercholesterolemia, unspecified: Secondary | ICD-10-CM | POA: Diagnosis not present

## 2024-08-09 DIAGNOSIS — M8580 Other specified disorders of bone density and structure, unspecified site: Secondary | ICD-10-CM | POA: Diagnosis not present

## 2024-08-09 DIAGNOSIS — I700 Atherosclerosis of aorta: Secondary | ICD-10-CM | POA: Diagnosis not present

## 2024-08-09 MED ORDER — ROSUVASTATIN CALCIUM 20 MG PO TABS: 20.0000 mg | ORAL_TABLET | Freq: Every day | ORAL | 0 refills | Status: DC

## 2024-08-09 MED ORDER — ROSUVASTATIN CALCIUM 20 MG PO TABS: 20.0000 mg | ORAL_TABLET | Freq: Every day | ORAL | 4 refills | Status: AC

## 2024-08-09 NOTE — Patient Instructions (Addendum)
 Please return for fasting labs at your convenience in the next week.  Come in well-hydrated.  Remember you are due for your next mammogram after April 8th and bone density after September 4th.    Preventive Care 85 Years and Older, Female Preventive care refers to lifestyle choices and visits with your health care provider that can promote health and wellness. Preventive care visits are also called wellness exams. What can I expect for my preventive care visit? Counseling Your health care provider may ask you questions about your: Medical history, including: Past medical problems. Family medical history. Pregnancy and menstrual history. History of falls. Current health, including: Memory and ability to understand (cognition). Emotional well-being. Home life and relationship well-being. Sexual activity and sexual health. Lifestyle, including: Alcohol, nicotine or tobacco, and drug use. Access to firearms. Diet, exercise, and sleep habits. Work and work astronomer. Sunscreen use. Safety issues such as seatbelt and bike helmet use. Physical exam Your health care provider will check your: Height and weight. These may be used to calculate your BMI (body mass index). BMI is a measurement that tells if you are at a healthy weight. Waist circumference. This measures the distance around your waistline. This measurement also tells if you are at a healthy weight and may help predict your risk of certain diseases, such as type 2 diabetes and high blood pressure. Heart rate and blood pressure. Body temperature. Skin for abnormal spots. What immunizations do I need?  Vaccines are usually given at various ages, according to a schedule. Your health care provider will recommend vaccines for you based on your age, medical history, and lifestyle or other factors, such as travel or where you work. What tests do I need? Screening Your health care provider may recommend screening tests for certain  conditions. This may include: Lipid and cholesterol levels. Hepatitis C test. Hepatitis B test. HIV (human immunodeficiency virus) test. STI (sexually transmitted infection) testing, if you are at risk. Lung cancer screening. Colorectal cancer screening. Diabetes screening. This is done by checking your blood sugar (glucose) after you have not eaten for a while (fasting). Mammogram. Talk with your health care provider about how often you should have regular mammograms. BRCA-related cancer screening. This may be done if you have a family history of breast, ovarian, tubal, or peritoneal cancers. Bone density scan. This is done to screen for osteoporosis. Talk with your health care provider about your test results, treatment options, and if necessary, the need for more tests. Follow these instructions at home: Eating and drinking  Eat a diet that includes fresh fruits and vegetables, whole grains, lean protein, and low-fat dairy products. Limit your intake of foods with high amounts of sugar, saturated fats, and salt. Take vitamin and mineral supplements as recommended by your health care provider. Do not drink alcohol if your health care provider tells you not to drink. If you drink alcohol: Limit how much you have to 0-1 drink a day. Know how much alcohol is in your drink. In the [place] one drink equals one 12 oz bottle of beer (355 mL), one 5 oz glass of wine (148 mL), or one 1 oz glass of hard liquor (44 mL). Lifestyle Brush your teeth every morning and night with fluoride toothpaste. Floss one time each day. Exercise for at least 30 minutes 5 or more days each week. Do not use any products that contain nicotine or tobacco. These products include cigarettes, chewing tobacco, and vaping devices, such as e-cigarettes. If you need help  quitting, ask your health care provider. Do not use drugs. If you are sexually active, practice safe sex. Use a condom or other form of protection in order to  prevent STIs. Take aspirin only as told by your health care provider. Make sure that you understand how much to take and what form to take. Work with your health care provider to find out whether it is safe and beneficial for you to take aspirin daily. Ask your health care provider if you need to take a cholesterol-lowering medicine (statin). Find healthy ways to manage stress, such as: Meditation, yoga, or listening to music. Journaling. Talking to a trusted person. Spending time with friends and family. Minimize exposure to UV radiation to reduce your risk of skin cancer. Safety Always wear your seat belt while driving or riding in a vehicle. Do not drive: If you have been drinking alcohol. Do not ride with someone who has been drinking. When you are tired or distracted. While texting. If you have been using any mind-altering substances or drugs. Wear a helmet and other protective equipment during sports activities. If you have firearms in your house, make sure you follow all gun safety procedures. What's next? Visit your health care provider once a year for an annual wellness visit. Ask your health care provider how often you should have your eyes and teeth checked. Stay up to date on all vaccines. This information is not intended to replace advice given to you by your health care provider. Make sure you discuss any questions you have with your health care provider. Document Revised: 01/14/2021 Document Reviewed: 01/14/2021 Elsevier Patient Education  2024 [employer].

## 2024-08-09 NOTE — Progress Notes (Signed)
 "  Complete physical exam  Patient: Cassandra May   DOB: [birth year]   69 y.o. Female  MRN: [id number]  Subjective:    Chief Complaint  Patient presents with   Annual Exam    Medication refill   She is here for a complete physical exam.  Discussed the use of AI scribe software for clinical note transcription with the patient, who gave verbal consent to proceed.  History of Present Illness Cassandra May is a 69 year old female who presents for her annual physical exam and preventive healthcare visit.  Preventive health maintenance - Presents for annual physical examination and preventive care - Up to date on mammogram and colonoscopy - Next bone density scan due September 2026 - Cholesterol last checked February 2025: LDL 79 mg/dL - Receives regular dermatology follow-up at [facility]  Immunization status - Received influenza, COVID, pneumonia, and shingles vaccines - Has not yet received RSV vaccine  Lipid management - Takes rosuvastatin  (Crestor ) 20 mg daily for hyperlipidemia - LDL 79 mg/dL as of February 2025  Lifestyle and physical activity - Maintains active lifestyle with regular biking, walking, skiing, body pump, and swimming - Retired, spends time with grandchildren and traveling  Neurological and vascular symptoms - No swelling, numbness, or tingling  Family history - Family history notable for cardiovascular disease    Health Maintenance  Topic Date Due   Zoster (Shingles) Vaccine (1 of 2) 01/01/1973   COVID-19 Vaccine (6 - 2025-26 season) 04/02/2024   Medicare Annual Wellness Visit  09/20/2024   Breast Cancer Screening  11/07/2025   DTaP/Tdap/Td vaccine (2 - Td or Tdap) 08/10/2026   Colon Cancer Screening  08/15/2028   Pneumococcal Vaccine for age over 43  Completed   Flu Shot  Completed   Osteoporosis screening with Bone Density Scan  Completed   Hepatitis C Screening  Completed   Meningitis B Vaccine  Aged Out   Hepatitis B  Vaccine  Discontinued     Depression screening:    09/21/2023    8:09 AM 06/08/2023    9:46 AM 04/30/2022    8:40 AM  Depression screen PHQ 2/9  Decreased Interest 0 0 0  Down, Depressed, Hopeless 0 0 0  PHQ - 2 Score 0 0 0   Anxiety Screening:     No data to display           Patient Care Team: Lendia Boby CROME, NP-C as PCP - General (Family Medicine)   Show/hide medication list[1]  Review of Systems  Constitutional:  Negative for chills, fever, malaise/fatigue and weight loss.  HENT:  Negative for congestion, ear pain, sinus pain and sore throat.   Eyes:  Negative for blurred vision, double vision and pain.  Respiratory:  Negative for cough, shortness of breath and wheezing.   Cardiovascular:  Negative for chest pain, palpitations and leg swelling.  Gastrointestinal:  Negative for abdominal pain, constipation, diarrhea, nausea and vomiting.  Genitourinary:  Negative for dysuria, frequency and urgency.  Musculoskeletal:  Negative for back pain, joint pain and myalgias.  Skin:  Negative for rash.  Neurological:  Negative for dizziness, focal weakness and headaches.  Psychiatric/Behavioral:  Negative for depression and memory loss. The patient is not nervous/anxious.        Objective:    BP 120/70 (BP Location: Left Arm, Patient Position: Sitting, Cuff Size: Small)   Pulse 64   Temp 97.6 F (36.4 C) (Oral)   Ht 5' 3 (1.6 m)   Wt 127 lb (  57.6 kg)   SpO2 98%   BMI 22.50 kg/m  BP Readings from Last 3 Encounters:  08/09/24 120/70  03/23/24 (!) 143/74  03/15/24 118/78   Wt Readings from Last 3 Encounters:  08/09/24 127 lb (57.6 kg)  03/15/24 128 lb 6.4 oz (58.2 kg)  09/23/23 124 lb (56.2 kg)    Physical Exam Constitutional:      General: She is not in acute distress.    Appearance: She is not ill-appearing.  HENT:     Right Ear: Tympanic membrane, ear canal and external ear normal.     Left Ear: Tympanic membrane, ear canal and external ear normal.      Nose: Nose normal.     Mouth/Throat:     Mouth: Mucous membranes are moist.     Pharynx: Oropharynx is clear.  Eyes:     Extraocular Movements: Extraocular movements intact.     Conjunctiva/sclera: Conjunctivae normal.     Pupils: Pupils are equal, round, and reactive to light.  Neck:     Thyroid : No thyroid  mass, thyromegaly or thyroid  tenderness.  Cardiovascular:     Rate and Rhythm: Normal rate and regular rhythm.     Pulses: Normal pulses.     Heart sounds: Normal heart sounds.  Pulmonary:     Effort: Pulmonary effort is normal.     Breath sounds: Normal breath sounds.  Abdominal:     General: Bowel sounds are normal.     Palpations: Abdomen is soft.     Tenderness: There is no abdominal tenderness. There is no right CVA tenderness, left CVA tenderness, guarding or rebound.  Musculoskeletal:        General: Normal range of motion.     Cervical back: Normal range of motion and neck supple. No tenderness.     Right lower leg: No edema.     Left lower leg: No edema.  Lymphadenopathy:     Cervical: No cervical adenopathy.  Skin:    General: Skin is warm and dry.     Findings: No lesion or rash.  Neurological:     General: No focal deficit present.     Mental Status: She is alert and oriented to person, place, and time.     Cranial Nerves: No cranial nerve deficit.     Sensory: No sensory deficit.     Motor: No weakness.     Gait: Gait normal.  Psychiatric:        Mood and Affect: Mood normal.        Behavior: Behavior normal.        Thought Content: Thought content normal.      No results found for any visits on 08/09/24.    Assessment & Plan:    Routine Health Maintenance and Physical Exam Problem List Items Addressed This Visit     Aortic atherosclerosis   Relevant Medications   rosuvastatin  (CRESTOR ) 20 MG tablet   Other Relevant Orders   CBC with Differential/Platelet   Lipid panel   Elevated coronary artery calcium  score   Relevant Medications    rosuvastatin  (CRESTOR ) 20 MG tablet   Encounter for general adult medical examination with abnormal findings - Primary   Osteopenia   Other Visit Diagnoses       Pure hypercholesterolemia       Relevant Medications   rosuvastatin  (CRESTOR ) 20 MG tablet   Other Relevant Orders   Lipid panel     Vitamin D  deficiency  Relevant Orders   VITAMIN D  25 Hydroxy (Vit-D Deficiency, Fractures)     Screening for thyroid  disorder       Relevant Orders   TSH       Assessment and Plan Assessment & Plan Hyperlipidemia Well-controlled with current medication regimen. LDL was 79 in February, within the target range of less than 100 as per cardiologist's recommendation. She is on rosuvastatin  20 mg daily. - Continue rosuvastatin  20 mg oral daily - Ordered fasting lipid panel  Osteopenia Bone density scan is due this year. Last scan was in September 2024. - Will schedule bone density scan in September  General Health Maintenance She is up to date on mammogram and colonoscopy. Bone density scan is due this year. She receives annual flu and COVID vaccinations and has completed pneumonia and shingles vaccines. She is considering the RSV vaccine. - Recommended RSV vaccine - Continue annual flu and COVID vaccinations - Ensure regular dental and eye check-ups - Maintain regular exercise routine - Follow up for fasting labs.  - check vitamin D  level. She currently takes a MVI - Donates blood and takes iron     Return in about 1 year (around 08/09/2025) for fasting CPE.     Boby Mackintosh, NP-C      [1]  Outpatient Medications Prior to Visit  Medication Sig   Multiple Vitamins-Iron (MULTIPLE VITAMIN/IRON PO) Take by mouth.   [DISCONTINUED] rosuvastatin  (CRESTOR ) 20 MG tablet Take 1 tablet (20 mg total) by mouth daily. NEEDS APPOINTMENT FOR REFILLS.   [DISCONTINUED] FLUZONE HIGH-DOSE 0.5 ML injection Inject 0.5 mLs into the muscle once. (Patient not taking: Reported on 03/15/2024)    [DISCONTINUED] PREVNAR 20 0.5 ML injection Inject 0.5 mLs into the muscle once. (Patient not taking: Reported on 03/15/2024)   No facility-administered medications prior to visit.   "

## 2024-08-14 ENCOUNTER — Ambulatory Visit: Payer: Self-pay | Admitting: Family Medicine

## 2024-08-14 ENCOUNTER — Other Ambulatory Visit

## 2024-08-14 DIAGNOSIS — E559 Vitamin D deficiency, unspecified: Secondary | ICD-10-CM

## 2024-08-14 DIAGNOSIS — I700 Atherosclerosis of aorta: Secondary | ICD-10-CM

## 2024-08-14 DIAGNOSIS — E7800 Pure hypercholesterolemia, unspecified: Secondary | ICD-10-CM

## 2024-08-14 DIAGNOSIS — Z1329 Encounter for screening for other suspected endocrine disorder: Secondary | ICD-10-CM

## 2024-08-14 LAB — CBC WITH DIFFERENTIAL/PLATELET
Basophils Absolute: 0.1 K/uL (ref 0.0–0.1)
Basophils Relative: 1 % (ref 0.0–3.0)
Eosinophils Absolute: 0.2 K/uL (ref 0.0–0.7)
Eosinophils Relative: 2.7 % (ref 0.0–5.0)
HCT: 37.5 % (ref 36.0–46.0)
Hemoglobin: 12.1 g/dL (ref 12.0–15.0)
Lymphocytes Relative: 34.5 % (ref 12.0–46.0)
Lymphs Abs: 2.3 K/uL (ref 0.7–4.0)
MCHC: 32.4 g/dL (ref 30.0–36.0)
MCV: 82.1 fl (ref 78.0–100.0)
Monocytes Absolute: 0.8 K/uL (ref 0.1–1.0)
Monocytes Relative: 12.7 % — ABNORMAL HIGH (ref 3.0–12.0)
Neutro Abs: 3.2 K/uL (ref 1.4–7.7)
Neutrophils Relative %: 49.1 % (ref 43.0–77.0)
Platelets: 217 K/uL (ref 150.0–400.0)
RBC: 4.57 Mil/uL (ref 3.87–5.11)
RDW: 16.2 % — ABNORMAL HIGH (ref 11.5–15.5)
WBC: 6.6 K/uL (ref 4.0–10.5)

## 2024-08-14 LAB — LIPID PANEL
Cholesterol: 177 mg/dL (ref 28–200)
HDL: 72 mg/dL
LDL Cholesterol: 85 mg/dL (ref 10–99)
NonHDL: 104.52
Total CHOL/HDL Ratio: 2
Triglycerides: 100 mg/dL (ref 10.0–149.0)
VLDL: 20 mg/dL (ref 0.0–40.0)

## 2024-08-14 LAB — TSH: TSH: 3.02 u[IU]/mL (ref 0.35–5.50)

## 2024-08-14 LAB — VITAMIN D 25 HYDROXY (VIT D DEFICIENCY, FRACTURES): VITD: 36.6 ng/mL (ref 30.00–100.00)

## 2025-06-10 ENCOUNTER — Ambulatory Visit: Payer: Medicare Other
